# Patient Record
Sex: Female | Born: 1990 | Race: White | Hispanic: Yes | Marital: Single | Smoking: Former smoker
Health system: Southern US, Community
[De-identification: ages and names within clinical notes are randomized; demographics above are authoritative.]

## PROBLEM LIST (undated history)

## (undated) DIAGNOSIS — K219 Gastro-esophageal reflux disease without esophagitis: Secondary | ICD-10-CM

## (undated) HISTORY — PX: NO PAST SURGERIES: SHX2092

---
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
## 2010-05-13 LAB — POCT URINALYSIS DIP (DEVICE)
Glucose, UA: NEGATIVE mg/dL
Specific Gravity, Urine: 1.025 (ref 1.005–1.030)
Urobilinogen, UA: 0.2 mg/dL (ref 0.0–1.0)

## 2010-10-27 LAB — BASIC METABOLIC PANEL
CO2: 21
Calcium: 10.6 — ABNORMAL HIGH
Creatinine, Ser: 0.93
Glucose, Bld: 95

## 2010-10-27 LAB — URINE MICROSCOPIC-ADD ON

## 2010-10-27 LAB — CBC
MCHC: 34.2
RDW: 12.5

## 2010-10-27 LAB — DIFFERENTIAL
Basophils Absolute: 0
Basophils Relative: 0
Eosinophils Absolute: 0
Monocytes Absolute: 0.5
Neutro Abs: 5.6
Neutrophils Relative %: 73 — ABNORMAL HIGH

## 2010-10-27 LAB — URINALYSIS, ROUTINE W REFLEX MICROSCOPIC
Glucose, UA: NEGATIVE
Ketones, ur: >80 — AB
Specific Gravity, Urine: 1.021
pH: 6.5

## 2010-10-27 LAB — URINE CULTURE: Colony Count: 15000

## 2011-01-23 ENCOUNTER — Encounter: Payer: Self-pay | Admitting: *Deleted

## 2011-01-23 ENCOUNTER — Inpatient Hospital Stay (HOSPITAL_COMMUNITY)
Admission: AD | Admit: 2011-01-23 | Discharge: 2011-01-23 | Disposition: A | Discharge status: Home / Self Care | Payer: Federal, State, Local not specified - PPO | Source: Ambulatory Visit | Attending: Obstetrics and Gynecology | Admitting: Obstetrics and Gynecology

## 2011-01-23 ENCOUNTER — Encounter (HOSPITAL_COMMUNITY): Payer: Self-pay | Admitting: *Deleted

## 2011-01-23 ENCOUNTER — Inpatient Hospital Stay (HOSPITAL_COMMUNITY): Payer: Federal, State, Local not specified - PPO

## 2011-01-23 ENCOUNTER — Emergency Department (INDEPENDENT_AMBULATORY_CARE_PROVIDER_SITE_OTHER)
Admission: EM | Admit: 2011-01-23 | Discharge: 2011-01-23 | Disposition: A | Discharge status: Home / Self Care | Payer: Federal, State, Local not specified - PPO | Source: Home / Self Care | Attending: Emergency Medicine | Admitting: Emergency Medicine

## 2011-01-23 DIAGNOSIS — O4690 Antepartum hemorrhage, unspecified, unspecified trimester: Secondary | ICD-10-CM

## 2011-01-23 DIAGNOSIS — O209 Hemorrhage in early pregnancy, unspecified: Secondary | ICD-10-CM | POA: Insufficient documentation

## 2011-01-23 DIAGNOSIS — R109 Unspecified abdominal pain: Secondary | ICD-10-CM | POA: Insufficient documentation

## 2011-01-23 LAB — WET PREP, GENITAL: Yeast Wet Prep HPF POC: NONE SEEN

## 2011-01-23 LAB — ABO/RH: ABO/RH(D): A NEG

## 2011-01-23 MED ORDER — PROMETHAZINE HCL 25 MG PO TABS: 25.0000 mg | ORAL_TABLET | Freq: Four times a day (QID) | ORAL | Status: DC | PRN

## 2011-01-23 MED ORDER — PRENATAL RX 60-1 MG PO TABS: 1.0000 | ORAL_TABLET | Freq: Every day | ORAL | Status: DC

## 2011-01-23 MED ORDER — RHO D IMMUNE GLOBULIN 1500 UNIT/2ML IJ SOLN
300.0000 ug | Freq: Once | INTRAMUSCULAR | Status: AC
  Administered 2011-01-23: 300 ug via INTRAMUSCULAR

## 2011-01-23 NOTE — ED Notes (Signed)
Pt with vaginal spotting and abd cramping x one week c/o nausea and headaches

## 2011-01-23 NOTE — Progress Notes (Signed)
Wynelle Bourgeois CNM in to see pt. Spec exam done and wet prep and GC/Chlam obtained. PT tol well. Discussed u/s results and need for Rhophylac. Pt agrees with plan of care.

## 2011-01-23 NOTE — ED Notes (Signed)
Pt unable to recall last menstrual cycle probable in October

## 2011-01-23 NOTE — ED Provider Notes (Signed)
History     CSN: [id number]  Arrival date & time 01/23/11  1652   First MD Initiated Contact with Patient 01/23/11 1708      Chief Complaint  Patient presents with  . Vaginal Bleeding  . Abdominal Pain    (Consider location/radiation/quality/duration/timing/severity/associated sxs/prior treatment) HPI Comments: Spotting and having cramping pains for 1 week, feeling nauseous and headaches, not sure when was my LMP" " Maybe 2 MONTHS AGO" " No i have not had a urine test at home, wasn't sure" cramping pains come and go"  Patient is a 20 y.o. female presenting with vaginal bleeding and abdominal pain. The history is provided by the patient.  Vaginal Bleeding This is a recurrent problem. The current episode started more than 1 week ago. The problem occurs every several days. Associated symptoms include abdominal pain. Pertinent negatives include no headaches. The symptoms are relieved by nothing.  Abdominal Pain The primary symptoms of the illness include abdominal pain, nausea and vaginal bleeding. The primary symptoms of the illness do not include vomiting or vaginal discharge.  Symptoms associated with the illness do not include frequency.    History reviewed. No pertinent past medical history.  History reviewed. No pertinent past surgical history.  History reviewed. No pertinent family history.  History  Substance Use Topics  . Smoking status: Current Everyday Smoker  . Smokeless tobacco: Not on file  . Alcohol Use: Yes    OB History    Grav Para Term Preterm Abortions TAB SAB Ect Mult Living                  Review of Systems  Gastrointestinal: Positive for nausea and abdominal pain. Negative for vomiting and rectal pain.  Genitourinary: Positive for vaginal bleeding and pelvic pain. Negative for frequency, vaginal discharge, genital sores and vaginal pain.  Neurological: Negative for headaches.    Allergies  Review of patient's allergies indicates no known  allergies.  Home Medications  No current outpatient prescriptions on file.  BP 126/59  Pulse 71  Temp(Src) 98.7 F (37.1 C) (Oral)  Resp 16  SpO2 100%  Physical Exam  Nursing note and vitals reviewed. Constitutional: Vital signs are normal. She appears well-developed. She is active.  Non-toxic appearance. She does not have a sickly appearance. She does not appear ill. No distress.       Deferred exam- will be transferred to [facility] for comprehensive exam-  Neurological: She is alert.    ED Course  Procedures (including critical care time)   Labs Reviewed  POCT PREGNANCY, URINE  POCT PREGNANCY, URINE   No results found.   1. Vaginal bleeding in pregnancy       MDM  Intermittent VB + pregnancy test and unknown LMP with pelvic cramps and pains- Stable comfortable- Discussed transferred with MAU (H.N) for further evaluation        Jimmie Molly, MD 01/23/11 1936

## 2011-01-23 NOTE — ED Provider Notes (Signed)
History     Chief Complaint  Patient presents with  . Vaginal Bleeding  . Abdominal Pain   HPI This is a 20 y.o. female at [redacted]w[redacted]d who presents with C/O cramping and bleeding for several days. Went to Urgent Care and was diagnosed with pregnancy and sent here. No other symptoms except some nausea   OB History    Grav Para Term Preterm Abortions TAB SAB Ect Mult Living   2               Past Medical History  Diagnosis Date  . No pertinent past medical history     Past Surgical History  Procedure Date  . No past surgeries     Family History  Problem Relation Age of Onset  . Anesthesia problems Neg Hx   . Hypotension Neg Hx   . Pseudochol deficiency Neg Hx   . Malignant hyperthermia Neg Hx     History  Substance Use Topics  . Smoking status: Current Everyday Smoker  . Smokeless tobacco: Not on file  . Alcohol Use: Yes    Allergies: No Known Allergies  No prescriptions prior to admission    ROS See above  Physical Exam   Blood pressure 109/57, pulse 72, temperature 98.7 F (37.1 C), temperature source Oral, resp. rate 16, height 5' 2.25" (1.581 m), weight 101 lb 4 oz (45.927 kg), last menstrual period 11/23/2010.  Physical Exam  Constitutional: She is oriented to person, place, and time. She appears well-developed and well-nourished. No distress.  HENT:  Head: Normocephalic.  Cardiovascular: Normal rate.   Respiratory: Effort normal.  GI: Soft. She exhibits no distension and no mass. There is no tenderness. There is no rebound and no guarding.  Genitourinary: Uterus normal. Vaginal discharge (blood tinged mucous) found.       Uterus small, nontender, adnexae nontender without mass  Musculoskeletal: Normal range of motion.  Neurological: She is alert and oriented to person, place, and time.  Skin: Skin is warm and dry.  Psychiatric: She has a normal mood and affect.   Results for orders placed during the hospital encounter of 01/23/11 (from the past 24  hour(s))  ABO/RH     Status: Normal   Collection Time   01/23/11  8:40 PM      Component Value Range   ABO/RH(D) A NEG    HCG, QUANTITATIVE, PREGNANCY     Status: Abnormal   Collection Time   01/23/11  8:40 PM      Component Value Range   hCG, Beta Chain, Quant, [place] [address] (*) <5 (mIU/mL)  WET PREP, GENITAL     Status: Abnormal   Collection Time   01/23/11  9:45 PM      Component Value Range   Yeast, Wet Prep NONE SEEN  NONE SEEN    Trich, Wet Prep NONE SEEN  NONE SEEN    Clue Cells, Wet Prep MANY (*) NONE SEEN    WBC, Wet Prep HPF POC FEW (*) NONE SEEN     US Ob Comp Less 14 Wks  01/23/2011  The *RADIOLOGY REPORT*  Clinical Data: Positive pregnancy test.  Vaginal bleeding.  OBSTETRIC <14 WK [place] AND TRANSVAGINAL OB US  Technique:  Both transabdominal and transvaginal ultrasound examinations were performed for complete evaluation of the gestation as well as the maternal uterus, adnexal regions, and pelvic cul-de-sac.  Transvaginal technique was performed to assess early pregnancy.  Comparison:  None.  Intrauterine gestational sac:  Single. Yolk sac: Visualized  Embryo: Visualized Cardiac Activity: Visualized Heart Rate: 127 bpm  CRL: 6.9    mm  6   w  4   d        [place] EDC: 09/14/2011  Maternal uterus/adnexae:  Small to moderate subchorionic hemorrhage is noted.  The maternal ovaries are normal bilaterally.  No evidence for adnexal mass.  No free fluid is noted in the cul-de-sac.  IMPRESSION: Single living intrauterine gestation at estimated 6-week-4-day gestational age.  There is small to moderate subchorionic hemorrhage associated.  Original Report Authenticated By: ERIC A. MANSELL, M.D.    MAU Course  Procedures  MDM Rhophylac given GC/Chlam/Wet prep done.  Assessment and Plan  A:  IUP at 6.4 weeks (per [place])      First Trimester bleeding, [facility] P:  D/C home after Rhophylac      Encouraged to seen Prenatal care     Rx Phenergan and PNV    [facility] 01/23/2011, 9:57 PM

## 2011-01-23 NOTE — Progress Notes (Signed)
Written and verbal d/c instructions given and understanding voiced. To begin prenatal care with provider of choice.

## 2011-01-23 NOTE — Progress Notes (Signed)
Pt states, " I started bleeding last Saturday and I thought I  Was starting my period, but then I had spotting off and on all week. I woke up at 2:45 am this morning having chills and sweats and was having cramping, shooting pain in my whole abdomen. I've been nauseated all day with mild cramping all day. I went to [facility] Urgent care and they told me I was pregnant and sent me here."

## 2011-01-24 ENCOUNTER — Other Ambulatory Visit: Payer: Self-pay | Admitting: Advanced Practice Midwife

## 2011-01-24 LAB — RH IG WORKUP (INCLUDES ABO/RH): Unit division: 0

## 2011-01-24 MED ORDER — PROMETHAZINE HCL 25 MG PO TABS: 25.0000 mg | ORAL_TABLET | Freq: Four times a day (QID) | ORAL | Status: AC | PRN

## 2011-01-24 MED ORDER — PRENATAL RX 60-1 MG PO TABS: 1.0000 | ORAL_TABLET | Freq: Every day | ORAL | Status: AC

## 2011-01-24 NOTE — ED Provider Notes (Signed)
Agree with above note.  Lori Glass 01/24/2011 7:45 AM

## 2011-01-25 LAB — GC/CHLAMYDIA PROBE AMP, GENITAL
Chlamydia, DNA Probe: NEGATIVE
GC Probe Amp, Genital: NEGATIVE

## 2011-02-18 LAB — OB RESULTS CONSOLE RUBELLA ANTIBODY, IGM: Rubella: IMMUNE

## 2011-02-18 LAB — OB RESULTS CONSOLE ABO/RH: RH Type: NEGATIVE

## 2011-02-18 LAB — OB RESULTS CONSOLE RPR: RPR: NONREACTIVE

## 2011-02-18 LAB — OB RESULTS CONSOLE HIV ANTIBODY (ROUTINE TESTING): HIV: NONREACTIVE

## 2011-02-18 LAB — OB RESULTS CONSOLE ANTIBODY SCREEN: Antibody Screen: POSITIVE

## 2011-08-30 NOTE — H&P (Signed)
Lori Glass  DICTATION # [id number] CSN# [id number]   Meriel Pica, MD 08/30/2011 2:05 PM

## 2011-08-31 ENCOUNTER — Encounter (HOSPITAL_COMMUNITY): Payer: Self-pay

## 2011-08-31 NOTE — H&P (Signed)
Lori Glass, [name]                 ACCOUNT NO.:  [id number]  MEDICAL RECORD NO.:  [id number]  LOCATION:                                 FACILITY:  PHYSICIAN:  Lori Glass, M.D.DATE OF BIRTH:  1990-11-27  DATE OF ADMISSION:  09/09/2011 DATE OF DISCHARGE:                             HISTORY & PHYSICAL   CHIEF COMPLAINT:  Breech presentation at term.  HISTORY OF PRESENT ILLNESS:  A 21 year old, G1, P0, EDD September 15, 2011, presents for primary cesarean section for persistent breech presentation at term.  GBS culture was negative.  Her prenatal course has otherwise been uneventful.  The procedure including risks related to bleeding, infection, transfusion adjacent to organ injury, wound infection, phlebitis all reviewed with her, which she understands and accepts.  She is Rh negative.  PAST MEDICAL HISTORY:  Please see the [facility] form for details.  PHYSICAL EXAMINATION:  VITAL SIGNS:  Temp 98.2, blood pressure 120/72. HEENT:  Unremarkable. NECK:  Supple without masses. LUNGS:  Clear. CARDIOVASCULAR:  Regular rate and rhythm without murmurs, rubs, or gallops. BREASTS:  Not examined. PELVIC:  Term fundal height.  Breech presentation.  Fetal heart rate 148.  Cervix was closed. EXTREMITIES:  Unremarkable. NEUROLOGIC:  Unremarkable.  IMPRESSION:  Persistent breech presentation at term.  PLAN:  Primary cesarean section.  Procedure and risks discussed as above.     Lori Glass M. Marcelle Glass, M.D.     RMH/MEDQ  D:  08/30/2011  T:  08/31/2011  Job:  [id number]

## 2011-09-01 ENCOUNTER — Encounter (HOSPITAL_COMMUNITY): Payer: Self-pay | Admitting: Pharmacist

## 2011-09-02 ENCOUNTER — Encounter (HOSPITAL_COMMUNITY)
Admission: RE | Admit: 2011-09-02 | Discharge: 2011-09-02 | Disposition: A | Discharge status: Home / Self Care | Payer: Federal, State, Local not specified - PPO | Source: Ambulatory Visit | Attending: Obstetrics and Gynecology | Admitting: Obstetrics and Gynecology

## 2011-09-02 ENCOUNTER — Encounter (HOSPITAL_COMMUNITY): Payer: Self-pay

## 2011-09-02 HISTORY — DX: Gastro-esophageal reflux disease without esophagitis: K21.9

## 2011-09-02 LAB — CBC
HCT: 37.6 % (ref 36.0–46.0)
Hemoglobin: 12 g/dL (ref 12.0–15.0)
MCH: 27.1 pg (ref 26.0–34.0)
MCV: 85.1 fL (ref 78.0–100.0)
RBC: 4.42 MIL/uL (ref 3.87–5.11)

## 2011-09-02 LAB — TYPE AND SCREEN
Antibody Screen: POSITIVE
DAT, IgG: NEGATIVE

## 2011-09-02 NOTE — Patient Instructions (Addendum)
   Your procedure is scheduled on: Thursday August 8th  Enter through the Main Entrance of [facility] at: 11:30am Pick up the phone at the desk and dial [phone] and inform us of your arrival.  Please call this number if you have any problems the morning of surgery: [phone]  Remember: Do not eat food after midnight: Wednesday Do not drink clear liquids after: Thursday at 9am Take these medicines the morning of surgery : Prilosec Do not wear jewelry, make-up, or FINGER nail polish No metal in your hair or on your body. Do not wear lotions, powders, perfumes or deodorant. Do not shave 48 hours prior to surgery. Do not bring valuables to the hospital. Contacts, dentures or bridgework may not be worn into surgery.  Leave suitcase in the car. After Surgery it may be brought to your room. For patients being admitted to the hospital, checkout time is 11:00am the day of discharge.     Remember to use your hibiclens as instructed.Please shower with 1/2 bottle the evening before your surgery and the other 1/2 bottle the morning of surgery. Neck down avoiding private area.

## 2011-09-06 ENCOUNTER — Inpatient Hospital Stay (HOSPITAL_COMMUNITY)
Admission: AD | Admit: 2011-09-06 | Discharge: 2011-09-09 | DRG: 371 | Disposition: A | Discharge status: Home / Self Care | Payer: Federal, State, Local not specified - PPO | Source: Ambulatory Visit | Attending: Obstetrics and Gynecology | Admitting: Obstetrics and Gynecology

## 2011-09-06 DIAGNOSIS — O321XX0 Maternal care for breech presentation, not applicable or unspecified: Principal | ICD-10-CM | POA: Diagnosis present

## 2011-09-06 NOTE — MAU Note (Signed)
Pt reports ROM at 2300, clear fluid. No contractions

## 2011-09-07 ENCOUNTER — Encounter (HOSPITAL_COMMUNITY): Payer: Self-pay | Admitting: *Deleted

## 2011-09-07 ENCOUNTER — Encounter (HOSPITAL_COMMUNITY): Payer: Self-pay

## 2011-09-07 ENCOUNTER — Inpatient Hospital Stay (HOSPITAL_COMMUNITY): Payer: Federal, State, Local not specified - PPO

## 2011-09-07 ENCOUNTER — Encounter (HOSPITAL_COMMUNITY)
Admission: AD | Disposition: A | Discharge status: Home / Self Care | Payer: Self-pay | Source: Ambulatory Visit | Attending: Obstetrics and Gynecology

## 2011-09-07 LAB — CBC
Hemoglobin: 12 g/dL (ref 12.0–15.0)
MCH: 27.1 pg (ref 26.0–34.0)
MCHC: 32.2 g/dL (ref 30.0–36.0)
RDW: 14 % (ref 11.5–15.5)

## 2011-09-07 SURGERY — Surgical Case
Anesthesia: Spinal | Site: Abdomen | Wound class: Clean Contaminated

## 2011-09-07 MED ORDER — MORPHINE SULFATE (PF) 0.5 MG/ML IJ SOLN
INTRAMUSCULAR | Status: DC | PRN
  Administered 2011-09-07: .1 mg via INTRATHECAL

## 2011-09-07 MED ORDER — DIBUCAINE 1 % RE OINT: 1.0000 "application " | TOPICAL_OINTMENT | RECTAL | Status: DC | PRN

## 2011-09-07 MED ORDER — LACTATED RINGERS IV SOLN
INTRAVENOUS | Status: DC | PRN
  Administered 2011-09-07: 01:00:00 via INTRAVENOUS

## 2011-09-07 MED ORDER — KETOROLAC TROMETHAMINE 60 MG/2ML IM SOLN
60.0000 mg | Freq: Once | INTRAMUSCULAR | Status: AC | PRN
  Administered 2011-09-07: 60 mg via INTRAMUSCULAR

## 2011-09-07 MED ORDER — SCOPOLAMINE 1 MG/3DAYS TD PT72
1.0000 | MEDICATED_PATCH | Freq: Once | TRANSDERMAL | Status: DC
  Administered 2011-09-07: 1.5 mg via TRANSDERMAL

## 2011-09-07 MED ORDER — SODIUM CHLORIDE 0.9 % IV SOLN
1.0000 ug/kg/h | INTRAVENOUS | Status: DC | PRN
  Filled 2011-09-07: qty 2.5

## 2011-09-07 MED ORDER — KETOROLAC TROMETHAMINE 60 MG/2ML IM SOLN
INTRAMUSCULAR | Status: AC
  Administered 2011-09-07: 60 mg via INTRAMUSCULAR
  Filled 2011-09-07: qty 2

## 2011-09-07 MED ORDER — IBUPROFEN 100 MG/5ML PO SUSP
600.0000 mg | Freq: Four times a day (QID) | ORAL | Status: DC
  Administered 2011-09-07 – 2011-09-09 (×9): 600 mg via ORAL
  Filled 2011-09-07 (×9): qty 30

## 2011-09-07 MED ORDER — CITRIC ACID-SODIUM CITRATE 334-500 MG/5ML PO SOLN
ORAL | Status: AC
  Administered 2011-09-07: 30 mL via ORAL
  Filled 2011-09-07: qty 15

## 2011-09-07 MED ORDER — OXYTOCIN 10 UNIT/ML IJ SOLN
40.0000 [IU] | INTRAVENOUS | Status: DC | PRN
  Administered 2011-09-07: 40 [IU] via INTRAVENOUS

## 2011-09-07 MED ORDER — METOCLOPRAMIDE HCL 5 MG/ML IJ SOLN: 10.0000 mg | Freq: Three times a day (TID) | INTRAMUSCULAR | Status: DC | PRN

## 2011-09-07 MED ORDER — FAMOTIDINE IN NACL 20-0.9 MG/50ML-% IV SOLN
INTRAVENOUS | Status: AC
  Administered 2011-09-07: 20 mg via INTRAVENOUS
  Filled 2011-09-07: qty 50

## 2011-09-07 MED ORDER — LANOLIN HYDROUS EX OINT: 1.0000 "application " | TOPICAL_OINTMENT | CUTANEOUS | Status: DC | PRN

## 2011-09-07 MED ORDER — SODIUM CHLORIDE 0.9 % IJ SOLN: 3.0000 mL | INTRAMUSCULAR | Status: DC | PRN

## 2011-09-07 MED ORDER — OXYCODONE-ACETAMINOPHEN 5-325 MG PO TABS: 1.0000 | ORAL_TABLET | ORAL | Status: DC | PRN

## 2011-09-07 MED ORDER — ONDANSETRON HCL 4 MG/2ML IJ SOLN: 4.0000 mg | Freq: Three times a day (TID) | INTRAMUSCULAR | Status: DC | PRN

## 2011-09-07 MED ORDER — OXYCODONE-ACETAMINOPHEN 5-325 MG/5ML PO SOLN
5.0000 mL | ORAL | Status: DC | PRN
  Administered 2011-09-07 – 2011-09-08 (×2): 5 mL via ORAL
  Administered 2011-09-08: 10 mL via ORAL
  Administered 2011-09-08: 5 mL via ORAL
  Administered 2011-09-08: 10 mL via ORAL
  Administered 2011-09-08: 5 mL via ORAL
  Administered 2011-09-09 (×2): 10 mL via ORAL
  Filled 2011-09-07 (×2): qty 10
  Filled 2011-09-07 (×2): qty 5
  Filled 2011-09-07: qty 10
  Filled 2011-09-07 (×5): qty 5

## 2011-09-07 MED ORDER — LACTATED RINGERS IV SOLN
INTRAVENOUS | Status: DC | PRN
  Administered 2011-09-07: 02:00:00 via INTRAVENOUS

## 2011-09-07 MED ORDER — KETOROLAC TROMETHAMINE 30 MG/ML IJ SOLN: 30.0000 mg | Freq: Four times a day (QID) | INTRAMUSCULAR | Status: AC | PRN

## 2011-09-07 MED ORDER — SODIUM CHLORIDE 0.9 % IV SOLN: 250.0000 mL | INTRAVENOUS | Status: DC

## 2011-09-07 MED ORDER — FLEET ENEMA 7-19 GM/118ML RE ENEM: 1.0000 | ENEMA | Freq: Every day | RECTAL | Status: DC | PRN

## 2011-09-07 MED ORDER — DIPHENHYDRAMINE HCL 25 MG PO CAPS: 25.0000 mg | ORAL_CAPSULE | Freq: Four times a day (QID) | ORAL | Status: DC | PRN

## 2011-09-07 MED ORDER — DIPHENHYDRAMINE HCL 50 MG/ML IJ SOLN: 25.0000 mg | INTRAMUSCULAR | Status: DC | PRN

## 2011-09-07 MED ORDER — PRENATAL MULTIVITAMIN CH
1.0000 | ORAL_TABLET | Freq: Every day | ORAL | Status: DC
  Filled 2011-09-07: qty 1

## 2011-09-07 MED ORDER — FENTANYL CITRATE 0.05 MG/ML IJ SOLN
INTRAMUSCULAR | Status: DC | PRN
  Administered 2011-09-07: 15 ug via INTRATHECAL

## 2011-09-07 MED ORDER — IBUPROFEN 600 MG PO TABS: 600.0000 mg | ORAL_TABLET | Freq: Four times a day (QID) | ORAL | Status: DC | PRN

## 2011-09-07 MED ORDER — MEPERIDINE HCL 25 MG/ML IJ SOLN: 6.2500 mg | INTRAMUSCULAR | Status: DC | PRN

## 2011-09-07 MED ORDER — TETANUS-DIPHTH-ACELL PERTUSSIS 5-2.5-18.5 LF-MCG/0.5 IM SUSP: 0.5000 mL | Freq: Once | INTRAMUSCULAR | Status: DC

## 2011-09-07 MED ORDER — OXYTOCIN 40 UNITS IN LACTATED RINGERS INFUSION - SIMPLE MED: 62.5000 mL/h | INTRAVENOUS | Status: AC

## 2011-09-07 MED ORDER — SIMETHICONE 80 MG PO CHEW: 80.0000 mg | CHEWABLE_TABLET | ORAL | Status: DC | PRN

## 2011-09-07 MED ORDER — WITCH HAZEL-GLYCERIN EX PADS: 1.0000 "application " | MEDICATED_PAD | CUTANEOUS | Status: DC | PRN

## 2011-09-07 MED ORDER — SENNOSIDES-DOCUSATE SODIUM 8.6-50 MG PO TABS
2.0000 | ORAL_TABLET | Freq: Every day | ORAL | Status: DC
  Administered 2011-09-07 – 2011-09-08 (×2): 2 via ORAL

## 2011-09-07 MED ORDER — FENTANYL CITRATE 0.05 MG/ML IJ SOLN
INTRAMUSCULAR | Status: DC | PRN
  Administered 2011-09-07: 50 ug via INTRAVENOUS
  Administered 2011-09-07: 25 ug via INTRAVENOUS

## 2011-09-07 MED ORDER — MENTHOL 3 MG MT LOZG: 1.0000 | LOZENGE | OROMUCOSAL | Status: DC | PRN

## 2011-09-07 MED ORDER — ZOLPIDEM TARTRATE 5 MG PO TABS: 5.0000 mg | ORAL_TABLET | Freq: Every evening | ORAL | Status: DC | PRN

## 2011-09-07 MED ORDER — ONDANSETRON HCL 4 MG/2ML IJ SOLN: 4.0000 mg | INTRAMUSCULAR | Status: DC | PRN

## 2011-09-07 MED ORDER — COMPLETENATE 29-1 MG PO CHEW
1.0000 | CHEWABLE_TABLET | Freq: Every day | ORAL | Status: DC
  Administered 2011-09-07 – 2011-09-09 (×3): 1 via ORAL
  Filled 2011-09-07 (×3): qty 1

## 2011-09-07 MED ORDER — DIPHENHYDRAMINE HCL 25 MG PO CAPS: 25.0000 mg | ORAL_CAPSULE | ORAL | Status: DC | PRN

## 2011-09-07 MED ORDER — BUPIVACAINE IN DEXTROSE 0.75-8.25 % IT SOLN
INTRATHECAL | Status: DC | PRN
  Administered 2011-09-07: 1.5 mL via INTRATHECAL

## 2011-09-07 MED ORDER — NALOXONE HCL 0.4 MG/ML IJ SOLN: 0.4000 mg | INTRAMUSCULAR | Status: DC | PRN

## 2011-09-07 MED ORDER — ONDANSETRON HCL 4 MG PO TABS: 4.0000 mg | ORAL_TABLET | ORAL | Status: DC | PRN

## 2011-09-07 MED ORDER — SCOPOLAMINE 1 MG/3DAYS TD PT72
MEDICATED_PATCH | TRANSDERMAL | Status: AC
  Administered 2011-09-07: 1.5 mg via TRANSDERMAL
  Filled 2011-09-07: qty 1

## 2011-09-07 MED ORDER — ONDANSETRON HCL 4 MG/2ML IJ SOLN
INTRAMUSCULAR | Status: DC | PRN
  Administered 2011-09-07: 4 mg via INTRAVENOUS

## 2011-09-07 MED ORDER — 0.9 % SODIUM CHLORIDE (POUR BTL) OPTIME
TOPICAL | Status: DC | PRN
  Administered 2011-09-07: 500 mL

## 2011-09-07 MED ORDER — SIMETHICONE 80 MG PO CHEW
80.0000 mg | CHEWABLE_TABLET | Freq: Three times a day (TID) | ORAL | Status: DC
  Administered 2011-09-07 – 2011-09-09 (×8): 80 mg via ORAL

## 2011-09-07 MED ORDER — BISACODYL 10 MG RE SUPP: 10.0000 mg | Freq: Every day | RECTAL | Status: DC | PRN

## 2011-09-07 MED ORDER — LACTATED RINGERS IV SOLN
INTRAVENOUS | Status: DC
  Administered 2011-09-07: 125 mL/h via INTRAVENOUS

## 2011-09-07 MED ORDER — DIPHENHYDRAMINE HCL 50 MG/ML IJ SOLN: 12.5000 mg | INTRAMUSCULAR | Status: DC | PRN

## 2011-09-07 MED ORDER — FENTANYL CITRATE 0.05 MG/ML IJ SOLN: 25.0000 ug | INTRAMUSCULAR | Status: DC | PRN

## 2011-09-07 MED ORDER — EPHEDRINE SULFATE 50 MG/ML IJ SOLN
INTRAMUSCULAR | Status: DC | PRN
  Administered 2011-09-07: 10 mg via INTRAVENOUS

## 2011-09-07 MED ORDER — NALBUPHINE SYRINGE 5 MG/0.5 ML
5.0000 mg | INJECTION | INTRAMUSCULAR | Status: DC | PRN
  Filled 2011-09-07: qty 1

## 2011-09-07 MED ORDER — IBUPROFEN 600 MG PO TABS: 600.0000 mg | ORAL_TABLET | Freq: Four times a day (QID) | ORAL | Status: DC

## 2011-09-07 MED ORDER — CEFAZOLIN SODIUM-DEXTROSE 2-3 GM-% IV SOLR
INTRAVENOUS | Status: AC
  Filled 2011-09-07: qty 50

## 2011-09-07 MED ORDER — FAMOTIDINE IN NACL 20-0.9 MG/50ML-% IV SOLN
20.0000 mg | Freq: Once | INTRAVENOUS | Status: AC
  Administered 2011-09-07: 20 mg via INTRAVENOUS

## 2011-09-07 MED ORDER — CITRIC ACID-SODIUM CITRATE 334-500 MG/5ML PO SOLN
30.0000 mL | Freq: Once | ORAL | Status: AC
  Administered 2011-09-07: 30 mL via ORAL

## 2011-09-07 MED ORDER — CEFAZOLIN SODIUM-DEXTROSE 2-3 GM-% IV SOLR
2.0000 g | INTRAVENOUS | Status: AC
  Administered 2011-09-07: 2 g via INTRAVENOUS
  Filled 2011-09-07: qty 50

## 2011-09-07 MED ORDER — SODIUM CHLORIDE 0.9 % IJ SOLN: 3.0000 mL | Freq: Two times a day (BID) | INTRAMUSCULAR | Status: DC

## 2011-09-07 SURGICAL SUPPLY — 27 items
CLOTH BEACON ORANGE TIMEOUT ST (SAFETY) ×2 IMPLANT
DRESSING TELFA 8X3 (GAUZE/BANDAGES/DRESSINGS) IMPLANT
ELECT REM PT RETURN 9FT ADLT (ELECTROSURGICAL) ×2
ELECTRODE REM PT RTRN 9FT ADLT (ELECTROSURGICAL) ×1 IMPLANT
EXTRACTOR VACUUM M CUP 4 TUBE (SUCTIONS) IMPLANT
GAUZE SPONGE 4X4 12PLY STRL LF (GAUZE/BANDAGES/DRESSINGS) ×2 IMPLANT
GLOVE BIO SURGEON STRL SZ7 (GLOVE) ×4 IMPLANT
GOWN PREVENTION PLUS LG XLONG (DISPOSABLE) ×4 IMPLANT
KIT ABG SYR 3ML LUER SLIP (SYRINGE) ×2 IMPLANT
NEEDLE HYPO 25X5/8 SAFETYGLIDE (NEEDLE) ×2 IMPLANT
NS IRRIG 1000ML POUR BTL (IV SOLUTION) ×2 IMPLANT
PACK C SECTION WH (CUSTOM PROCEDURE TRAY) ×2 IMPLANT
PAD ABD 7.5X8 STRL (GAUZE/BANDAGES/DRESSINGS) IMPLANT
SLEEVE SCD COMPRESS KNEE MED (MISCELLANEOUS) ×2 IMPLANT
STAPLER VISISTAT 35W (STAPLE) ×2 IMPLANT
STRIP CLOSURE SKIN 1/4X4 (GAUZE/BANDAGES/DRESSINGS) IMPLANT
SUT CHROMIC 0 CT 1 (SUTURE) IMPLANT
SUT CHROMIC 0 CTX 36 (SUTURE) ×4 IMPLANT
SUT CHROMIC 2 0 SH (SUTURE) IMPLANT
SUT PDS AB 0 CT 36 (SUTURE) ×2 IMPLANT
SUT PLAIN 0 NONE (SUTURE) IMPLANT
SUT PLAIN 2 0 XLH (SUTURE) IMPLANT
SUT VIC AB 3-0 CT1 27 (SUTURE) ×1
SUT VIC AB 3-0 CT1 TAPERPNT 27 (SUTURE) ×1 IMPLANT
TOWEL OR 17X24 6PK STRL BLUE (TOWEL DISPOSABLE) ×4 IMPLANT
TRAY FOLEY CATH 14FR (SET/KITS/TRAYS/PACK) ×2 IMPLANT
WATER STERILE IRR 1000ML POUR (IV SOLUTION) ×2 IMPLANT

## 2011-09-07 NOTE — Brief Op Note (Signed)
09/06/2011 - 09/07/2011  1:57 AM  PATIENT:  Lori Glass  21 y.o. female  PRE-OPERATIVE DIAGNOSIS:  ruptured, breech  POST-OPERATIVE DIAGNOSIS:  ruptured, breech  PROCEDURE:  Procedure(s) (LRB): CESAREAN SECTION (N/A)  SURGEON:  Surgeon(s) and Role:    * Juluis Mire, MD - Primary  PHYSICIAN ASSISTANT:   ASSISTANTS: none   ANESTHESIA:   spinal  EBL:  Total I/O In: 1800 [I.V.:1800] Out: -   BLOOD ADMINISTERED:none  DRAINS: Urinary Catheter (Foley)   LOCAL MEDICATIONS USED:  NONE  SPECIMEN:  Source of Specimen:  placenta  DISPOSITION OF SPECIMEN:  PATHOLOGY  COUNTS:  YES  TOURNIQUET:  * No tourniquets in log *  DICTATION: .Other Dictation: Dictation Number [id number]  PLAN OF CARE: Admit to inpatient   PATIENT DISPOSITION:  PACU - hemodynamically stable.   Delay start of Pharmacological VTE agent (>24hrs) due to surgical blood loss or risk of bleeding: no

## 2011-09-07 NOTE — Transfer of Care (Signed)
Immediate Anesthesia Transfer of Care Note  Patient: Lori Glass  Procedure(s) Performed: Procedure(s) (LRB): CESAREAN SECTION (N/A)  Patient Location: PACU  Anesthesia Type: Spinal  Level of Consciousness: awake, alert  and oriented  Airway & Oxygen Therapy: Patient Spontanous Breathing  Post-op Assessment: Report given to PACU RN and Post -op Vital signs reviewed and stable  Post vital signs: Reviewed and stable  Complications: No apparent anesthesia complications

## 2011-09-07 NOTE — H&P (Signed)
Lori Glass is a 21 y.o. female since to MAU with spontaneous rupture membranes. Estimated gestational age is 38+ weeks. She has a persistent breech presentation. She was scheduled for cesarean section later this week. Ultrasound in MAU confirms breech presentation. Exam revealed gross rupture of membranes. Patient proceed with primary cesarean section. Maternal Medical History:  Reason for admission: Reason for admission: rupture of membranes.  Contractions: Onset was 1-2 hours ago.   Frequency: rare.   Perceived severity is mild.    Fetal activity: Perceived fetal activity is normal.    Prenatal Complications - Diabetes: none.    OB History    Grav Para Term Preterm Abortions TAB SAB Ect Mult Living   1              Past Medical History  Diagnosis Date  . GERD (gastroesophageal reflux disease)     prilosec   Past Surgical History  Procedure Date  . No past surgeries    Family History: family history includes Arthritis in her mother; Asthma in her mother; and Cancer in her maternal grandfather.  There is no history of Anesthesia problems, and Hypotension, and Pseudochol deficiency, and Malignant hyperthermia, . Social History:  reports that she quit smoking about 8 months ago. She does not have any smokeless tobacco history on file. She reports that she does not drink alcohol or use illicit drugs.   Prenatal Transfer Tool  Maternal Diabetes: No Genetic Screening: Declined Maternal Ultrasounds/Referrals: Normal Fetal Ultrasounds or other Referrals:  None Maternal Substance Abuse:  No Significant Maternal Medications:  None Significant Maternal Lab Results:  None Other Comments:  None  Review of Systems  All other systems reviewed and are negative.      Blood pressure 123/72, pulse 89, temperature 98.1 F (36.7 C), temperature source Oral, resp. rate 20, height 5\' 2"  (1.575 m), weight 70.761 kg (156 lb), last menstrual period 11/23/2010, SpO2 100.00%. Maternal  Exam:  Uterine Assessment: Contraction strength is mild.  Contraction frequency is irregular.   Abdomen: Patient reports no abdominal tenderness. Fundal height is c/w dates.   Estimated fetal weight is 7.   Fetal presentation: breech  Introitus: Ferning test: positive.  Nitrazine test: not done. Amniotic fluid character: clear.  Cervix: Cervix evaluated by digital exam.     Physical Exam  Constitutional: She appears well-developed and well-nourished.  HENT:  Head: Normocephalic and atraumatic.  Eyes: Conjunctivae and EOM are normal. Pupils are equal, round, and reactive to light.  Cardiovascular: Normal rate, regular rhythm and normal heart sounds.   Respiratory: Effort normal.  GI:       Gravid uterus  Genitourinary:       Gross rom  Clear  Cervix long and closed  breech  Neurological: She is alert. She has normal reflexes.    Prenatal labs: ABO, Rh: --/--/A NEG (08/01 1610) Antibody: POS (08/01 0953) Rubella: Immune (01/17 0000) RPR: NON REACTIVE (08/01 0953)  HBsAg: Negative (01/17 0000)  HIV: Non-reactive (01/17 0000)  GBS:     Assessment/Plan: Intrauterine pregnancy at 38+ weeks with spontaneous rupture membranes and breech presentation. Proceed with primary cesarean section.Risk of cesarean section discussed.  These include:  Risk of infection;  Risk of hemorrhage that could require transfusions with the associated risk of aids or hepatitis;  Excessive bleeding could require hysterectomy;  Risk of injury to adjacent organs including bladder, bowel or ureters;  Risk of DVT's and possible pulmonary embolus.  Patient expresses a understanding of indications and risks.;  Lori Glass S 09/07/2011, 12:39 AM

## 2011-09-07 NOTE — Anesthesia Postprocedure Evaluation (Signed)
Anesthesia Post Note  Patient: Lori Glass  Procedure(s) Performed: Procedure(s) (LRB): CESAREAN SECTION (N/A)  Anesthesia type: Spinal  Patient location: PACU  Post pain: Pain level controlled  Post assessment: Post-op Vital signs reviewed  Last Vitals:  Filed Vitals:   09/07/11 0315  BP: 116/74  Pulse: 72  Temp:   Resp: 13    Post vital signs: Reviewed  Level of consciousness: awake  Complications: No apparent anesthesia complications

## 2011-09-07 NOTE — MAU Note (Signed)
Pt broke her water @ 2300 tonight.  She states she is breech.

## 2011-09-07 NOTE — Progress Notes (Addendum)
Subjective: Postpartum Day 0: Cesarean Delivery Patient reports tolerating PO.    Objective: Vital signs in last 24 hours: Temp:  [97.7 F (36.5 C)-98.8 F (37.1 C)] 97.8 F (36.6 C) (08/06 0649) Pulse Rate:  [70-96] 82  (08/06 0649) Resp:  [12-22] 18  (08/06 0649) BP: (116-137)/(66-89) 121/66 mmHg (08/06 0649) SpO2:  [98 %-100 %] 99 % (08/06 0649) Weight:  [70.761 kg (156 lb)] 70.761 kg (156 lb) (08/05 2350)  Physical Exam:  General: alert and cooperative Lochia: appropriate Uterine Fundus: firm Incision: abd. Dressing CDI DVT Evaluation: No evidence of DVT seen on physical exam. Foley with adequate op  Basename 09/07/11 0045  HGB 12.0  HCT 37.3    Assessment/Plan: Status post Cesarean section. Doing well postoperatively.  Continue current care.  Latasha Puskas G 09/07/2011, 7:53 AM

## 2011-09-07 NOTE — Anesthesia Procedure Notes (Signed)
Spinal  Patient location during procedure: OR Start time: 09/07/2011 1:32 AM Staffing Performed by: anesthesiologist  Preanesthetic Checklist Completed: patient identified, site marked, surgical consent, pre-op evaluation, timeout performed, IV checked, risks and benefits discussed and monitors and equipment checked Spinal Block Patient position: sitting Prep: site prepped and draped and DuraPrep Patient monitoring: heart rate, cardiac monitor, continuous pulse ox and blood pressure Approach: midline Location: L3-4 Injection technique: single-shot Needle Needle type: Sprotte  Needle gauge: 24 G Needle length: 9 cm Assessment Sensory level: T4 Additional Notes Clear free flow CSF on first attempt.  No paresthesia.  Patient tolerated procedure well.  Jasmine December, MD

## 2011-09-07 NOTE — Op Note (Signed)
Lori Glass, Lori Glass NO.:  [id number]  MEDICAL RECORD NO.:  [id number]  LOCATION:  9101                          FACILITY:  [facility]  PHYSICIAN:  Juluis Mire, M.D.   DATE OF BIRTH:  October 05, 1990  DATE OF PROCEDURE:  09/07/2011 DATE OF DISCHARGE:                              OPERATIVE REPORT   PREOPERATIVE DIAGNOSES:  Intrauterine pregnancy at 38 weeks and 6 days. Spontaneous rupture of membranes.  Breech presentation.  POSTOPERATIVE DIAGNOSES:  Intrauterine pregnancy at 38 weeks and 6 days. Spontaneous rupture of membranes.  Breech presentation.  OPERATIVE PROCEDURE:  Low-transverse cesarean section.  SURGEON:  Juluis Mire, M.D.  ANESTHESIA:  Spinal.  ESTIMATED BLOOD LOSS:  500 to 600 mL.  PACKS:  None.  DRAINS:  Urethral Foley.  INTRAOPERATIVE BLOOD PLACED:  None.  COMPLICATIONS:  None.  INDICATION:  Dictated in the history and physical.  PROCEDURE:  As follows:  The patient was taken to the OR and placed in a supine position with left lateral tilt.  After a satisfactory level of spinal anesthesia was obtained, the abdomen was prepped out with Betadine and draped as a sterile field.  Low-transverse skin incision was made with a knife, carried through subcutaneous tissue.  Fascia was entered sharply, incision then fashioned laterally.  Fascia taken off the muscle superiorly and inferiorly.  Rectus muscles were separated in midline.  Perineum was entered sharply.  Incision of peritoneum extended both superiorly and inferiorly.  Low transverse bladder flap was developed.  A low transverse uterine incision was begun with a knife and extended laterally using manual traction.  Amniotic fluid was clear. The infant was in the frank breech presentation, was delivered in usual manner.  The infant was a viable female, Apgars were 9/9.  Umbilical artery pH was 7.31.  Weight is pending.  Placenta was delivered manually.  It looked like she had a velamentous  insertion.  The placenta was sent to Pathology.  At this time, the uterus exteriorized for closure.  Uterus was closed in a running locking suture of 0-chromic using a two-layer closure technique.  Areas of continued bleeding brought under control with figure-of-eights of 0-chromic.  Tubes and ovaries were unremarkable.  Uterus was returned to the abdominal cavity. We irrigated the pelvis, had good hemostasis, and clear urine output. Muscles and peritoneum closed with a running suture of 3-0 Vicryl.  Fascia closed with a running suture of 0- PDS.  Skin was closed with staples and Steri-Strips.  Sponge, instrument, and needle count was reported as correct by circulating nurse x2.  The patient tolerated the procedure well, was returned to the recovery room in good condition and urine output remained clear.     Juluis Mire, M.D.     JSM/MEDQ  D:  09/07/2011  T:  09/07/2011  Job:  [id number]

## 2011-09-07 NOTE — OR Nursing (Signed)
Fundal massage by DLWegner RN 100 cc blood loss

## 2011-09-07 NOTE — Op Note (Signed)
Patient name  Lori Glass, Lori Glass [id number] CSN# [id number]  [facility], MD 09/07/2011 1:57 AM

## 2011-09-07 NOTE — Anesthesia Preprocedure Evaluation (Addendum)
Anesthesia Evaluation  Patient identified by MRN, date of birth, ID band Patient awake    Reviewed: Allergy & Precautions, H&P , NPO status , Patient's Chart, lab work & pertinent test results, reviewed documented beta blocker date and time   History of Anesthesia Complications Negative for: history of anesthetic complications  Airway Mallampati: II TM Distance: >3 FB Neck ROM: full    Dental  (+) Teeth Intact   Pulmonary former smoker,  breath sounds clear to auscultation        Cardiovascular negative cardio ROS  Rhythm:regular Rate:Normal     Neuro/Psych negative neurological ROS  negative psych ROS   GI/Hepatic Neg liver ROS, GERD-  Medicated,  Endo/Other  negative endocrine ROS  Renal/GU negative Renal ROS  negative genitourinary   Musculoskeletal   Abdominal   Peds  Hematology negative hematology ROS (+)   Anesthesia Other Findings ate cereal at 8 pm  Reproductive/Obstetrics (+) Pregnancy (breech, ruptured membranes)                          Anesthesia Physical Anesthesia Plan  ASA: II and Emergent  Anesthesia Plan: Spinal   Post-op Pain Management:    Induction:   Airway Management Planned:   Additional Equipment:   Intra-op Plan:   Post-operative Plan:   Informed Consent: I have reviewed the patients History and Physical, chart, labs and discussed the procedure including the risks, benefits and alternatives for the proposed anesthesia with the patient or authorized representative who has indicated his/her understanding and acceptance.   Dental Advisory Given  Plan Discussed with: Surgeon and CRNA  Anesthesia Plan Comments:         Anesthesia Quick Evaluation

## 2011-09-08 LAB — CBC
MCH: 27.4 pg (ref 26.0–34.0)
MCHC: 32.5 g/dL (ref 30.0–36.0)
RDW: 14.2 % (ref 11.5–15.5)

## 2011-09-08 MED ORDER — RHO D IMMUNE GLOBULIN 1500 UNIT/2ML IJ SOLN
300.0000 ug | Freq: Once | INTRAMUSCULAR | Status: AC
  Administered 2011-09-08: 300 ug via INTRAMUSCULAR
  Filled 2011-09-08: qty 2

## 2011-09-08 NOTE — Progress Notes (Signed)
Subjective: Postpartum Day 1: Cesarean Delivery Patient reports tolerating PO and no problems voiding.    Objective: Vital signs in last 24 hours: Temp:  [98 F (36.7 C)-99 F (37.2 C)] 98 F (36.7 C) (08/07 0436) Pulse Rate:  [75-105] 81  (08/07 0436) Resp:  [18] 18  (08/07 0436) BP: (95-122)/(53-74) 99/61 mmHg (08/07 0436) SpO2:  [97 %-99 %] 97 % (08/07 0436)  Physical Exam:  General: alert and cooperative Lochia: appropriate Uterine Fundus: firm Incision: abd dressing CDI  DVT Evaluation: No evidence of DVT seen on physical exam.   Basename 09/08/11 0535 09/07/11 0045  HGB 10.0* 12.0  HCT 30.8* 37.3    Assessment/Plan: Status post Cesarean section. Doing well postoperatively.  Continue current care.  CURTIS,CAROL G 09/08/2011, 7:58 AM

## 2011-09-09 ENCOUNTER — Inpatient Hospital Stay (HOSPITAL_COMMUNITY)
Admission: AD | Admit: 2011-09-09 | Payer: Federal, State, Local not specified - PPO | Source: Ambulatory Visit | Admitting: Obstetrics and Gynecology

## 2011-09-09 ENCOUNTER — Encounter (HOSPITAL_COMMUNITY): Admission: AD | Payer: Self-pay | Source: Ambulatory Visit

## 2011-09-09 ENCOUNTER — Encounter (HOSPITAL_COMMUNITY): Payer: Self-pay | Admitting: Obstetrics and Gynecology

## 2011-09-09 LAB — RH IG WORKUP (INCLUDES ABO/RH): Unit division: 0

## 2011-09-09 SURGERY — Surgical Case
Anesthesia: Regional

## 2011-09-09 MED ORDER — OXYCODONE-ACETAMINOPHEN 5-325 MG/5ML PO SOLN: 5.0000 mL | ORAL | Status: DC | PRN

## 2011-09-09 MED ORDER — IBUPROFEN 100 MG/5ML PO SUSP: 600.0000 mg | Freq: Four times a day (QID) | ORAL | Status: DC

## 2011-09-09 NOTE — Discharge Summary (Signed)
Obstetric Discharge Summary Reason for Admission: rupture of membranes Prenatal Procedures: ultrasound Intrapartum Procedures: cesarean: low cervical, transverse Postpartum Procedures: none Complications-Operative and Postpartum: none Hemoglobin  Date Value Range Status  09/08/2011 10.0* 12.0 - 15.0 Glass/dL Final     HCT  Date Value Range Status  09/08/2011 30.8* 36.0 - 46.0 % Final    Physical Exam:  General: alert and cooperative Lochia: appropriate Uterine Fundus: firm Incision: healing well, staples intact DVT Evaluation: No evidence of DVT seen on physical exam.  Discharge Diagnoses: Term Pregnancy-delivered  Discharge Information: Date: 09/09/2011 Activity: pelvic rest Diet: routine Medications: PNV, Ibuprofen and Percocet Condition: stable Instructions: refer to practice specific booklet Discharge to: home   Newborn Data: Live born female  Birth Weight: 7 lb 9.7 oz (3450 Glass) APGAR: 9, 9  Home with mother.  Lori Glass 09/09/2011, 7:51 AM

## 2011-09-09 NOTE — Plan of Care (Signed)
Problem: Discharge Progression Outcomes Goal: Remove staples per MD order Outcome: Not Applicable Date Met:  09/09/11 Office removal on Monday 12th

## 2011-09-22 ENCOUNTER — Ambulatory Visit (HOSPITAL_COMMUNITY)
Admission: RE | Admit: 2011-09-22 | Discharge: 2011-09-22 | Disposition: A | Discharge status: Home / Self Care | Payer: Federal, State, Local not specified - PPO | Source: Ambulatory Visit | Attending: Obstetrics and Gynecology | Admitting: Obstetrics and Gynecology

## 2011-09-22 NOTE — Progress Notes (Signed)
Adult Lactation Consultation Outpatient Visit Note  Patient Name: Lori Glass                          Lori Glass, DOB [birth year], Birth Weight 7 lb. 32 oz, now 18 days old. Date of Birth: [birth year] Gestational Age at Delivery: Unknown Type of Delivery: C/S  Breastfeeding History: Frequency of Breastfeeding: at the breast 4-5 times during the day, cluster feeding at night Length of Feeding: 10-20 minutes Voids: 8 per day Stools: 8 per day, green to orange in color  Supplementing / Method: Pumping:  Type of Pump:  Harmony Hand Pump   Frequency:  Pre-pumping 20-45 minutes, or will pump the breast the Lori did not feed on.  Volume:  2 oz after pumping few times per day.   Comments: Mom is single parent living with her parents. Mom and her mom are here today for feeding assessment and advise on how to supplement the Lori at night. Lori Lori Glass is 38 days old and cluster feeding at night. Mom is getting no rest and her parents want to help with the night time feedings. Mom reports she is breast feeding Lori Ayer 4-5 times during the day for 10-20 minutes, then he cluster feeds at night. She has tried to pre-pump to obtain milk to supplement but is not getting but a small amount with pumping 20-45 minutes. Mom is very frustrated.    Consultation Evaluation: With this feeding assessment, Lori Ayer latches easily and demonstrates a good rhythmic suck with lots of swallows audible. Mom latched her Lori well, some small adjustment with the lower lip was all that was needed. He transferred milk well with breastfeeding from both breasts. After the feeding, mom demonstrated to me how she was using her hand pump which was not correct. Demonstrated how to use the pump properly and mom pumped approx 40 ml in 8 minutes. She was very relieved.   Initial Feeding Assessment: Pre-feed Weight:  8 lb. 4.5 oz/3756 gm. Post-feed Weight:  8 lb. 5. 2 oz/3776 gm Amount Transferred:  20 ml.  Comments:   From left breast  Additional Feeding Assessment: Pre-feed Weight:  8 lb. 5.2 oz/ 3776 gm Post-feed Weight:   8 lb. 8.5 oz/3870 gm Amount Transferred:  94 ml. Comments:  From right breast  Additional Feeding Assessment: Pre-feed Weight: Post-feed Weight: Amount Transferred: Comments:  Total Breast milk Transferred this Visit: 114 ml. Approx. 3.8 oz. Total Supplement Given: none  Additional Interventions: Plan for mom is to put the Lori to the breast more frequently during the day and see if this helps with his cluster feeding at night. She can pre or post pump with a few feeding during the day so if her parents want to feed the Lori for 1 feeding during the night they can help. She is fine with them using a bottle. Mom was happy with this plan.   Follow-Up  Prn.    Alfred Levins 09/22/2011, 3:22 PM

## 2012-06-17 IMAGING — US US OB COMP LESS 14 WK
1 series · 14 of 28 positions shown · non-contrast
Comparison: None.

The *RADIOLOGY REPORT*
CLINICAL DATA: Positive pregnancy test.  Vaginal bleeding.

OBSTETRIC <14 WK US AND TRANSVAGINAL OB US
TECHNIQUE: Both transabdominal and transvaginal ultrasound
examinations were performed for complete evaluation of the
gestation as well as the maternal uterus, adnexal regions, and
pelvic cul-de-sac.  Transvaginal technique was performed to assess
early pregnancy.

[Series 1: us ob comp less 14 wks · 14 of 38 slices shown]
[im 2/38]
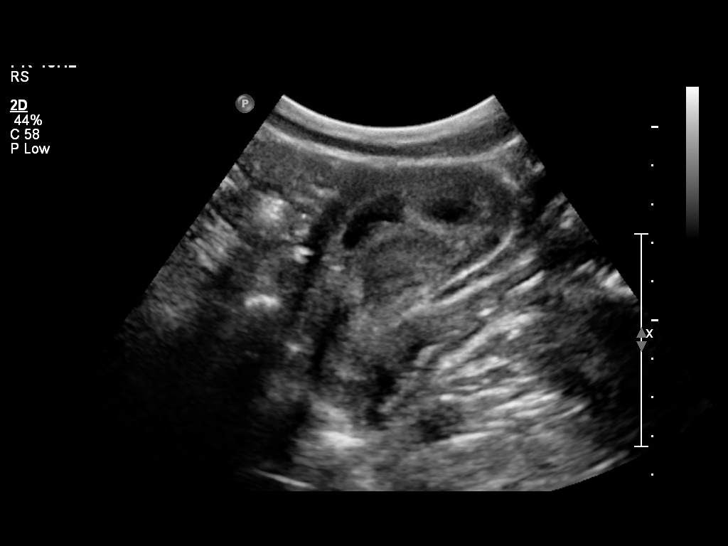
[im 5/38]
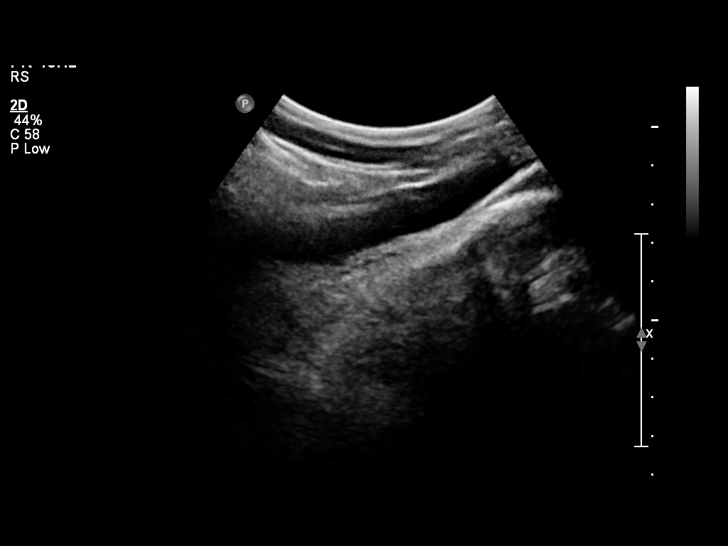
[im 7/38]
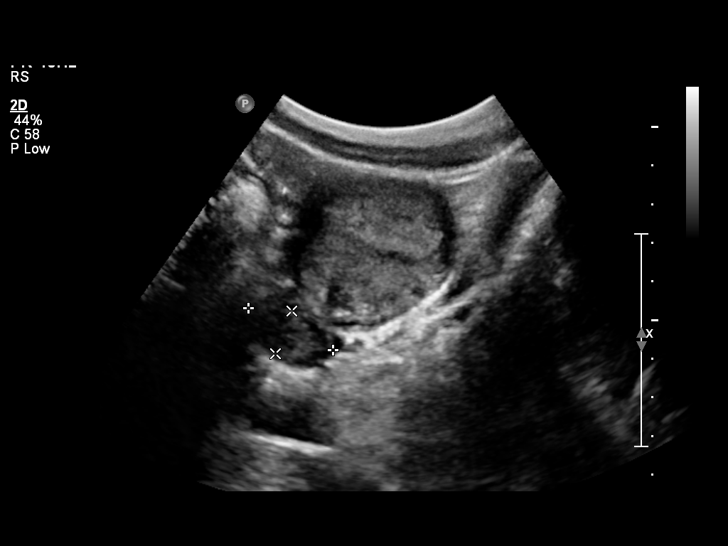
[im 10/38]
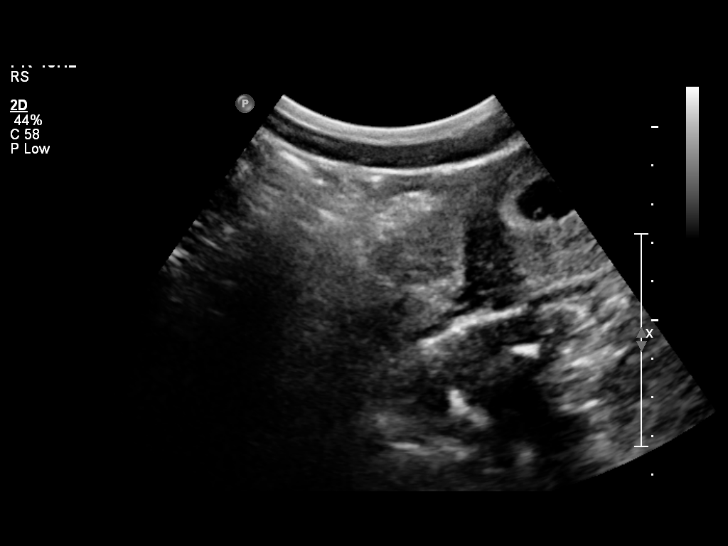
[im 13/38]
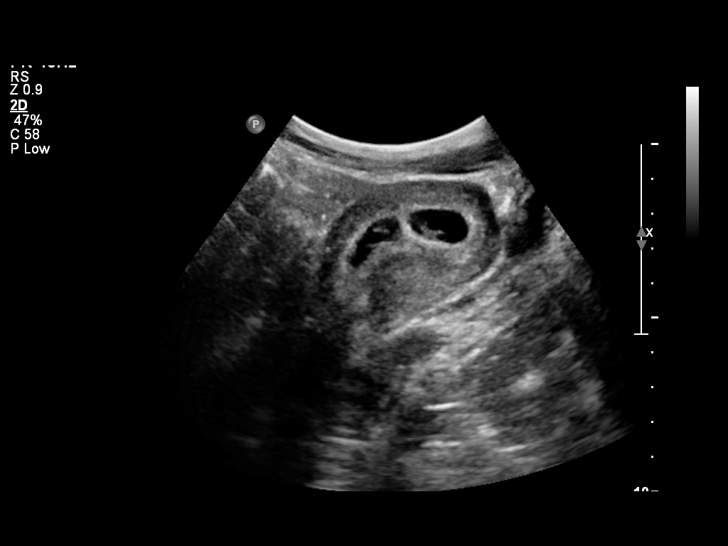
[im 16/38]
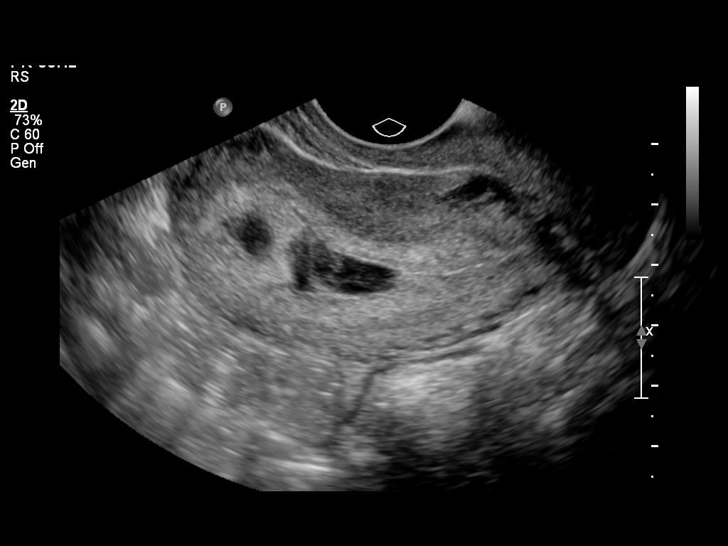
[im 18/38]
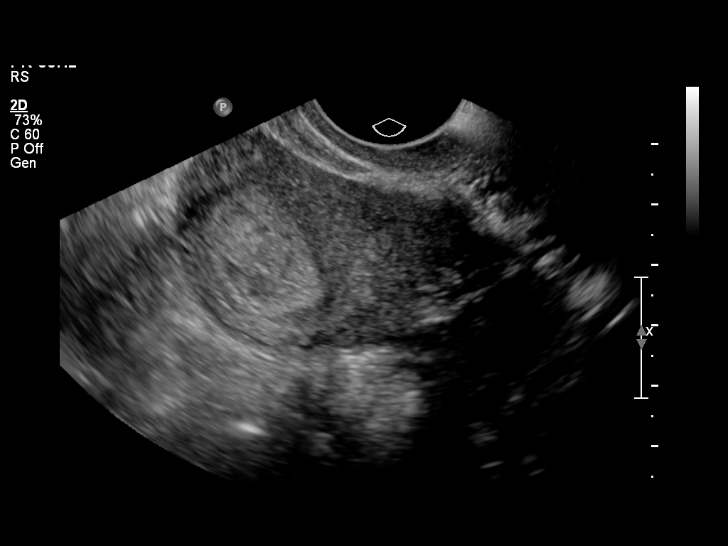
[im 21/38]
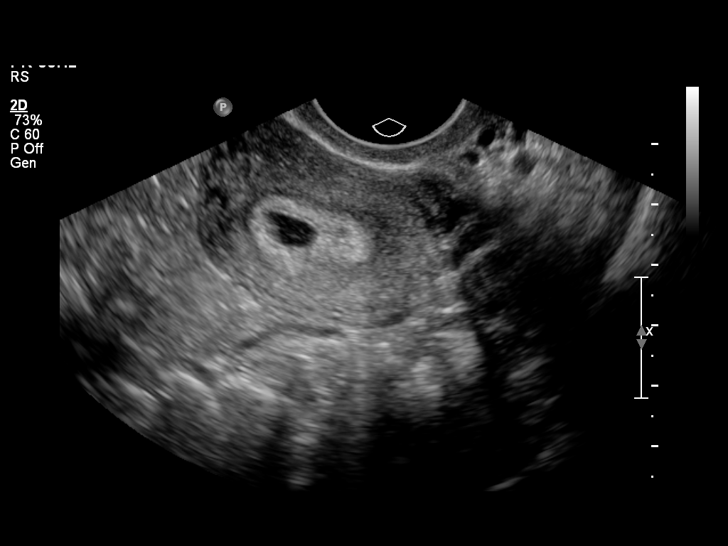
[im 24/38]
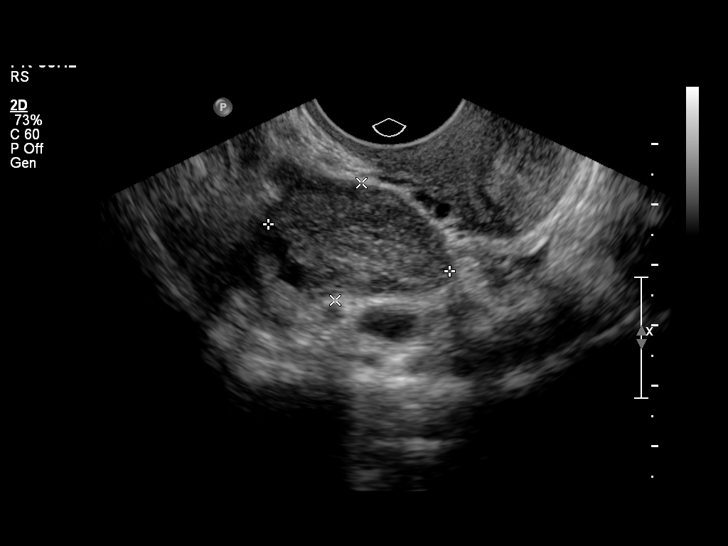
[im 27/38]
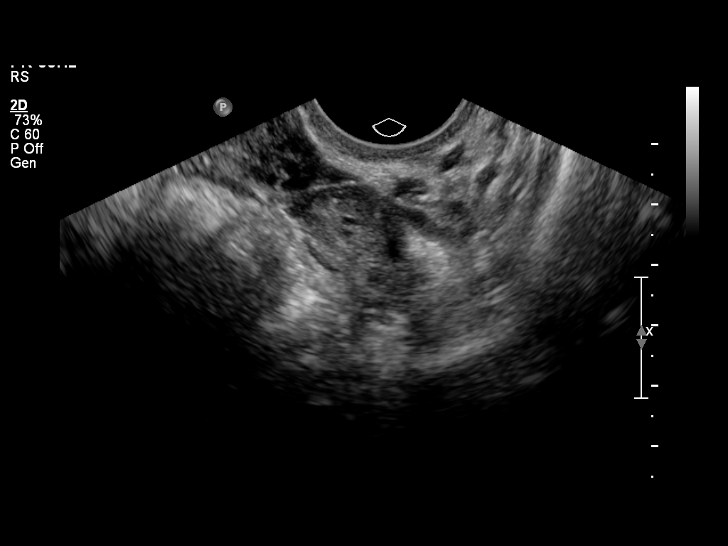
[im 29/38]
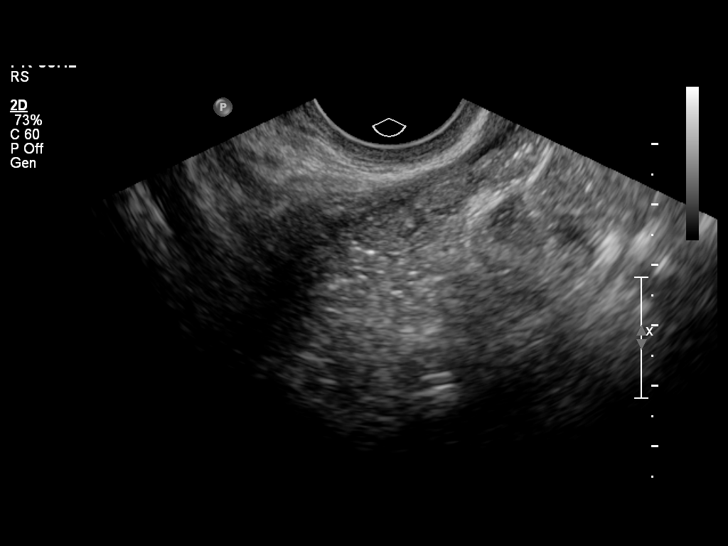
[im 32/38]
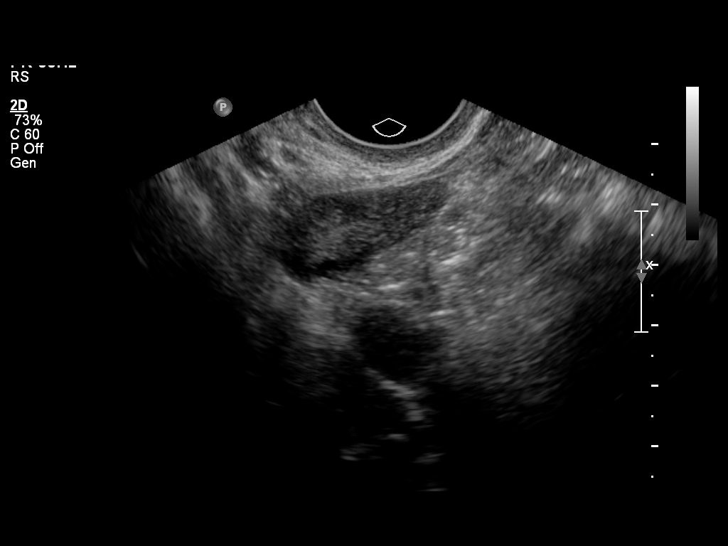
[im 35/38]
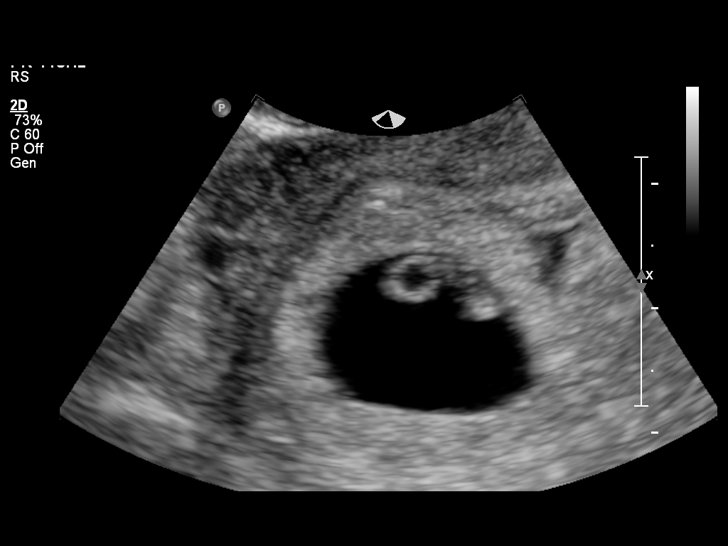
[im 38/38]
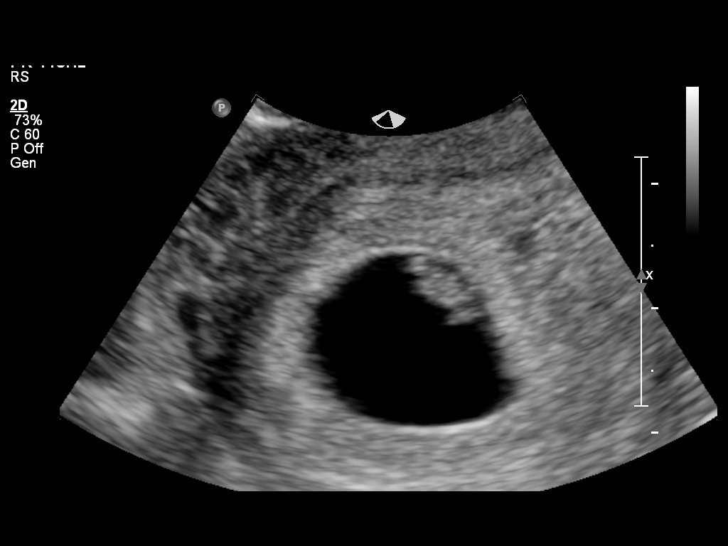

[14 of 28 positions shown; findings below may reference images not displayed]

Intrauterine gestational sac:  Single.
Yolk sac: Visualized
Embryo: Visualized
Cardiac Activity: Visualized
Heart Rate: 127 bpm

CRL: 6.9    mm  6   w  4   d        US EDC: 09/14/2011

Maternal uterus/adnexae:
 Small to moderate subchorionic hemorrhage is noted.  The maternal
ovaries are normal bilaterally.  No evidence for adnexal mass.  No
free fluid is noted in the cul-de-sac.
IMPRESSION: Single living intrauterine gestation at estimated 6-week-0-day
gestational age.  There is small to moderate subchorionic
hemorrhage associated.

## 2013-12-03 ENCOUNTER — Encounter (HOSPITAL_COMMUNITY): Payer: Self-pay | Admitting: Obstetrics and Gynecology

## 2014-02-01 NOTE — L&D Delivery Note (Cosign Needed)
Neonatology Note:   Attendance at C-section:    I was asked by Dr. Marcelle Overlie to attend this repeat C/S at term. The mother is a G2, P1, A neg, GBS neg with failure to progress. ROM with fluid clear. Infant vigorous with good spontaneous cry and tone. Needed only minimal bulb suctioning. Ap 9 and 9 at 1 and 5 minutes respectively.. Lungs clear to ausc in DR. To CN to care of Pediatrician.

## 2014-02-26 LAB — OB RESULTS CONSOLE GC/CHLAMYDIA
CHLAMYDIA, DNA PROBE: NEGATIVE
Chlamydia: NEGATIVE
Gonorrhea: NEGATIVE
Gonorrhea: NEGATIVE

## 2014-02-26 LAB — OB RESULTS CONSOLE HIV ANTIBODY (ROUTINE TESTING)
HIV: NONREACTIVE
HIV: NONREACTIVE

## 2014-02-26 LAB — OB RESULTS CONSOLE GBS
GBS: NEGATIVE
STREP GROUP B AG: NEGATIVE

## 2014-02-26 LAB — OB RESULTS CONSOLE RPR
RPR: NONREACTIVE
RPR: NONREACTIVE

## 2014-09-24 ENCOUNTER — Inpatient Hospital Stay (HOSPITAL_COMMUNITY)
Admission: AD | Admit: 2014-09-24 | Discharge: 2014-09-27 | DRG: 766 | Disposition: A | Discharge status: Home / Self Care | Payer: Federal, State, Local not specified - PPO | Source: Ambulatory Visit | Attending: Obstetrics and Gynecology | Admitting: Obstetrics and Gynecology

## 2014-09-24 ENCOUNTER — Encounter (HOSPITAL_COMMUNITY): Payer: Self-pay

## 2014-09-24 DIAGNOSIS — O480 Post-term pregnancy: Principal | ICD-10-CM | POA: Diagnosis present

## 2014-09-24 DIAGNOSIS — Z3490 Encounter for supervision of normal pregnancy, unspecified, unspecified trimester: Secondary | ICD-10-CM

## 2014-09-24 DIAGNOSIS — K219 Gastro-esophageal reflux disease without esophagitis: Secondary | ICD-10-CM | POA: Diagnosis present

## 2014-09-24 DIAGNOSIS — O3421 Maternal care for scar from previous cesarean delivery: Secondary | ICD-10-CM | POA: Diagnosis present

## 2014-09-24 DIAGNOSIS — O9962 Diseases of the digestive system complicating childbirth: Secondary | ICD-10-CM | POA: Diagnosis present

## 2014-09-24 DIAGNOSIS — Z8261 Family history of arthritis: Secondary | ICD-10-CM | POA: Diagnosis not present

## 2014-09-24 DIAGNOSIS — Z87891 Personal history of nicotine dependence: Secondary | ICD-10-CM

## 2014-09-24 DIAGNOSIS — Z3A40 40 weeks gestation of pregnancy: Secondary | ICD-10-CM | POA: Diagnosis present

## 2014-09-24 DIAGNOSIS — Z825 Family history of asthma and other chronic lower respiratory diseases: Secondary | ICD-10-CM | POA: Diagnosis not present

## 2014-09-24 LAB — CBC
HCT: 37.5 % (ref 36.0–46.0)
Hemoglobin: 12.3 g/dL (ref 12.0–15.0)
MCH: 27.8 pg (ref 26.0–34.0)
MCHC: 32.8 g/dL (ref 30.0–36.0)
MCV: 84.8 fL (ref 78.0–100.0)
PLATELETS: 205 10*3/uL (ref 150–400)
RBC: 4.42 MIL/uL (ref 3.87–5.11)
RDW: 15 % (ref 11.5–15.5)
WBC: 11.8 10*3/uL — AB (ref 4.0–10.5)

## 2014-09-24 LAB — TYPE AND SCREEN
ABO/RH(D): A NEG
ANTIBODY SCREEN: NEGATIVE

## 2014-09-24 LAB — RPR: RPR: NONREACTIVE

## 2014-09-24 MED ORDER — OXYTOCIN 40 UNITS IN LACTATED RINGERS INFUSION - SIMPLE MED
1.0000 m[IU]/min | INTRAVENOUS | Status: DC
  Administered 2014-09-24: 2 m[IU]/min via INTRAVENOUS
  Filled 2014-09-24: qty 1000

## 2014-09-24 MED ORDER — OXYCODONE-ACETAMINOPHEN 5-325 MG PO TABS: 2.0000 | ORAL_TABLET | ORAL | Status: DC | PRN

## 2014-09-24 MED ORDER — ONDANSETRON HCL 4 MG/2ML IJ SOLN: 4.0000 mg | Freq: Four times a day (QID) | INTRAMUSCULAR | Status: DC | PRN

## 2014-09-24 MED ORDER — LACTATED RINGERS IV SOLN: 500.0000 mL | INTRAVENOUS | Status: DC | PRN

## 2014-09-24 MED ORDER — LIDOCAINE HCL (PF) 1 % IJ SOLN: 30.0000 mL | INTRAMUSCULAR | Status: DC | PRN

## 2014-09-24 MED ORDER — CITRIC ACID-SODIUM CITRATE 334-500 MG/5ML PO SOLN
30.0000 mL | ORAL | Status: DC | PRN
  Administered 2014-09-25: 30 mL via ORAL
  Filled 2014-09-24: qty 15

## 2014-09-24 MED ORDER — FLEET ENEMA 7-19 GM/118ML RE ENEM: 1.0000 | ENEMA | RECTAL | Status: DC | PRN

## 2014-09-24 MED ORDER — OXYCODONE-ACETAMINOPHEN 5-325 MG PO TABS: 1.0000 | ORAL_TABLET | ORAL | Status: DC | PRN

## 2014-09-24 MED ORDER — OXYTOCIN 40 UNITS IN LACTATED RINGERS INFUSION - SIMPLE MED
1.0000 m[IU]/min | INTRAVENOUS | Status: DC
  Administered 2014-09-24: 1 m[IU]/min via INTRAVENOUS
  Filled 2014-09-24: qty 1000

## 2014-09-24 MED ORDER — TERBUTALINE SULFATE 1 MG/ML IJ SOLN: 0.2500 mg | Freq: Once | INTRAMUSCULAR | Status: DC | PRN

## 2014-09-24 MED ORDER — OXYTOCIN BOLUS FROM INFUSION: 500.0000 mL | INTRAVENOUS | Status: DC

## 2014-09-24 MED ORDER — ZOLPIDEM TARTRATE 5 MG PO TABS
5.0000 mg | ORAL_TABLET | Freq: Every evening | ORAL | Status: DC | PRN
  Administered 2014-09-24 (×2): 5 mg via ORAL
  Filled 2014-09-24 (×2): qty 1

## 2014-09-24 MED ORDER — ACETAMINOPHEN 325 MG PO TABS: 650.0000 mg | ORAL_TABLET | ORAL | Status: DC | PRN

## 2014-09-24 MED ORDER — LACTATED RINGERS IV SOLN
INTRAVENOUS | Status: DC
  Administered 2014-09-24: 125 mL/h via INTRAVENOUS
  Administered 2014-09-24: 20:00:00 via INTRAVENOUS
  Administered 2014-09-24: 125 mL/h via INTRAVENOUS
  Administered 2014-09-24 – 2014-09-25 (×3): via INTRAVENOUS

## 2014-09-24 MED ORDER — OXYTOCIN 40 UNITS IN LACTATED RINGERS INFUSION - SIMPLE MED: 62.5000 mL/h | INTRAVENOUS | Status: DC

## 2014-09-24 NOTE — Plan of Care (Signed)
Problem: Consults Goal: Birthing Suites Patient Information Press F2 to bring up selections list Outcome: Completed/Met Date Met:  09/24/14  Inpatient induction

## 2014-09-24 NOTE — Progress Notes (Signed)
Pt feeling mild discomfort with ctx.  No vb Requesting to eat  AF, VSS FHT cat 1 Abd - gravid, NT Cvx closed, softer.  Unable to place foley  A/P:  TOLAC, postdates Plan to d/c pitocin and allow to eat/shower Restart pitocin overnight

## 2014-09-24 NOTE — H&P (Signed)
Lori Glass is a 24 y.o. female G2P1 @ 40 wks presenting for IOL.  Pt with previous c-section, declines repeat c-section.  History OB History    Gravida Para Term Preterm AB TAB SAB Ectopic Multiple Living   Past Medical History  Diagnosis Date  . GERD (gastroesophageal reflux disease)     prilosec   Past Surgical History  Procedure Laterality Date  . No past surgeries    . Cesarean section  09/07/2011    Procedure: CESAREAN SECTION;  Surgeon: Juluis Mire, MD;  Location: [facility];  Service: Gynecology;  Laterality: N/A;  Primary cesarean section of baby boy at 81   APGAR 9/9   Family History: family history includes Arthritis in her mother; Asthma in her mother; Cancer in her maternal grandfather. There is no history of Anesthesia problems, Hypotension, Pseudochol deficiency, or Malignant hyperthermia. Social History:  reports that she quit smoking about 3 years ago. She does not have any smokeless tobacco history on file. She reports that she does not drink alcohol or use illicit drugs.   Prenatal Transfer Tool  Maternal Diabetes: No Genetic Screening: Declined Maternal Ultrasounds/Referrals: Normal Fetal Ultrasounds or other Referrals:  None Maternal Substance Abuse:  No Significant Maternal Medications:  None Significant Maternal Lab Results:  None Other Comments:  None  ROS  Dilation: Closed Effacement (%): Thick Exam by:: Dr Renaldo Fiddler Blood pressure 111/72, pulse 88, temperature 97.8 F (36.6 C), temperature source Oral, resp. rate 20, height  (1.6 m), weight 158 lb (71.668 kg), unknown if currently breastfeeding. Exam Physical Exam  Gen - NAD Abd - gravid, NT  EFW 7.5# Ext - NT, no edema Cvx closed Prenatal labs: ABO, Rh: --/--/A NEG (08/23 0120) Antibody: NEG (08/23 0120) Rubella:   RPR: Nonreactive, Nonreactive (01/26 0000)  HBsAg:    HIV: Non-reactive, Non-reactive (01/26 0000)  GBS: Negative, Negative (01/26 0000)    Assessment/Plan: TOLAC Pitocin IOL Epidural prn   Aizza Santiago 09/24/2014, 8:10 AM

## 2014-09-25 ENCOUNTER — Inpatient Hospital Stay (HOSPITAL_COMMUNITY): Payer: Federal, State, Local not specified - PPO | Admitting: Anesthesiology

## 2014-09-25 ENCOUNTER — Encounter (HOSPITAL_COMMUNITY)
Admission: AD | Disposition: A | Discharge status: Home / Self Care | Payer: Self-pay | Source: Ambulatory Visit | Attending: Obstetrics and Gynecology

## 2014-09-25 SURGERY — Surgical Case
Anesthesia: Spinal | Site: Abdomen

## 2014-09-25 MED ORDER — ONDANSETRON HCL 4 MG/2ML IJ SOLN
INTRAMUSCULAR | Status: DC | PRN
  Administered 2014-09-25: 4 mg via INTRAVENOUS

## 2014-09-25 MED ORDER — NALOXONE HCL 1 MG/ML IJ SOLN: 1.0000 ug/kg/h | INTRAVENOUS | Status: DC | PRN

## 2014-09-25 MED ORDER — SODIUM CHLORIDE 0.9 % IJ SOLN: 3.0000 mL | INTRAMUSCULAR | Status: DC | PRN

## 2014-09-25 MED ORDER — OXYCODONE-ACETAMINOPHEN 5-325 MG PO TABS
2.0000 | ORAL_TABLET | ORAL | Status: DC | PRN
  Administered 2014-09-27 (×2): 2 via ORAL
  Filled 2014-09-25 (×2): qty 2

## 2014-09-25 MED ORDER — MORPHINE SULFATE (PF) 0.5 MG/ML IJ SOLN
INTRAMUSCULAR | Status: DC | PRN
  Administered 2014-09-25: .1 mg via INTRATHECAL

## 2014-09-25 MED ORDER — SODIUM CHLORIDE 0.9 % IJ SOLN: 3.0000 mL | Freq: Two times a day (BID) | INTRAMUSCULAR | Status: DC

## 2014-09-25 MED ORDER — FENTANYL CITRATE (PF) 100 MCG/2ML IJ SOLN
INTRAMUSCULAR | Status: DC | PRN
  Administered 2014-09-25: 25 ug via INTRATHECAL

## 2014-09-25 MED ORDER — SIMETHICONE 80 MG PO CHEW
80.0000 mg | CHEWABLE_TABLET | ORAL | Status: DC
  Administered 2014-09-26 – 2014-09-27 (×2): 80 mg via ORAL
  Filled 2014-09-25 (×2): qty 1

## 2014-09-25 MED ORDER — PHENYLEPHRINE 8 MG IN D5W 100 ML (0.08MG/ML) PREMIX OPTIME
INJECTION | INTRAVENOUS | Status: AC
  Filled 2014-09-25: qty 100

## 2014-09-25 MED ORDER — DIPHENHYDRAMINE HCL 25 MG PO CAPS: 25.0000 mg | ORAL_CAPSULE | Freq: Four times a day (QID) | ORAL | Status: DC | PRN

## 2014-09-25 MED ORDER — SIMETHICONE 80 MG PO CHEW
80.0000 mg | CHEWABLE_TABLET | Freq: Three times a day (TID) | ORAL | Status: DC
  Administered 2014-09-26 – 2014-09-27 (×5): 80 mg via ORAL
  Filled 2014-09-25 (×5): qty 1

## 2014-09-25 MED ORDER — PRENATAL MULTIVITAMIN CH
1.0000 | ORAL_TABLET | Freq: Every day | ORAL | Status: DC
  Administered 2014-09-26 – 2014-09-27 (×2): 1 via ORAL
  Filled 2014-09-25 (×2): qty 1

## 2014-09-25 MED ORDER — SCOPOLAMINE 1 MG/3DAYS TD PT72
1.0000 | MEDICATED_PATCH | Freq: Once | TRANSDERMAL | Status: DC
  Administered 2014-09-25: 1.5 mg via TRANSDERMAL

## 2014-09-25 MED ORDER — MEASLES, MUMPS & RUBELLA VAC ~~LOC~~ INJ: 0.5000 mL | INJECTION | Freq: Once | SUBCUTANEOUS | Status: DC

## 2014-09-25 MED ORDER — MENTHOL 3 MG MT LOZG: 1.0000 | LOZENGE | OROMUCOSAL | Status: DC | PRN

## 2014-09-25 MED ORDER — DIPHENHYDRAMINE HCL 50 MG/ML IJ SOLN: 12.5000 mg | INTRAMUSCULAR | Status: DC | PRN

## 2014-09-25 MED ORDER — FLEET ENEMA 7-19 GM/118ML RE ENEM: 1.0000 | ENEMA | Freq: Every day | RECTAL | Status: DC | PRN

## 2014-09-25 MED ORDER — CEFAZOLIN SODIUM-DEXTROSE 2-3 GM-% IV SOLR
2.0000 g | Freq: Once | INTRAVENOUS | Status: DC
  Filled 2014-09-25: qty 50

## 2014-09-25 MED ORDER — SENNOSIDES-DOCUSATE SODIUM 8.6-50 MG PO TABS
2.0000 | ORAL_TABLET | ORAL | Status: DC
  Administered 2014-09-26 – 2014-09-27 (×2): 2 via ORAL
  Filled 2014-09-25 (×2): qty 2

## 2014-09-25 MED ORDER — ONDANSETRON HCL 4 MG/2ML IJ SOLN
INTRAMUSCULAR | Status: AC
  Filled 2014-09-25: qty 2

## 2014-09-25 MED ORDER — NALBUPHINE HCL 10 MG/ML IJ SOLN: 5.0000 mg | INTRAMUSCULAR | Status: DC | PRN

## 2014-09-25 MED ORDER — OXYTOCIN 10 UNIT/ML IJ SOLN
INTRAMUSCULAR | Status: AC
  Filled 2014-09-25: qty 4

## 2014-09-25 MED ORDER — ZOLPIDEM TARTRATE 5 MG PO TABS: 5.0000 mg | ORAL_TABLET | Freq: Every evening | ORAL | Status: DC | PRN

## 2014-09-25 MED ORDER — DIPHENHYDRAMINE HCL 25 MG PO CAPS
25.0000 mg | ORAL_CAPSULE | ORAL | Status: DC | PRN
  Filled 2014-09-25: qty 1

## 2014-09-25 MED ORDER — ONDANSETRON HCL 4 MG/2ML IJ SOLN: 4.0000 mg | Freq: Three times a day (TID) | INTRAMUSCULAR | Status: DC | PRN

## 2014-09-25 MED ORDER — MEPERIDINE HCL 25 MG/ML IJ SOLN: 6.2500 mg | INTRAMUSCULAR | Status: DC | PRN

## 2014-09-25 MED ORDER — WITCH HAZEL-GLYCERIN EX PADS: 1.0000 "application " | MEDICATED_PAD | CUTANEOUS | Status: DC | PRN

## 2014-09-25 MED ORDER — LANOLIN HYDROUS EX OINT: 1.0000 "application " | TOPICAL_OINTMENT | CUTANEOUS | Status: DC | PRN

## 2014-09-25 MED ORDER — NALBUPHINE HCL 10 MG/ML IJ SOLN: 5.0000 mg | Freq: Once | INTRAMUSCULAR | Status: DC | PRN

## 2014-09-25 MED ORDER — ACETAMINOPHEN 325 MG PO TABS: 650.0000 mg | ORAL_TABLET | ORAL | Status: DC | PRN

## 2014-09-25 MED ORDER — SCOPOLAMINE 1 MG/3DAYS TD PT72
MEDICATED_PATCH | TRANSDERMAL | Status: AC
  Administered 2014-09-25: 1.5 mg via TRANSDERMAL
  Filled 2014-09-25: qty 1

## 2014-09-25 MED ORDER — OXYTOCIN 40 UNITS IN LACTATED RINGERS INFUSION - SIMPLE MED: 62.5000 mL/h | INTRAVENOUS | Status: AC

## 2014-09-25 MED ORDER — KETOROLAC TROMETHAMINE 60 MG/2ML IM SOLN
INTRAMUSCULAR | Status: AC
  Administered 2014-09-25: 60 mg via INTRAMUSCULAR
  Filled 2014-09-25: qty 2

## 2014-09-25 MED ORDER — BUTORPHANOL TARTRATE 1 MG/ML IJ SOLN
1.0000 mg | Freq: Once | INTRAMUSCULAR | Status: AC
  Administered 2014-09-25: 1 mg via INTRAVENOUS
  Filled 2014-09-25: qty 1

## 2014-09-25 MED ORDER — SIMETHICONE 80 MG PO CHEW: 80.0000 mg | CHEWABLE_TABLET | ORAL | Status: DC | PRN

## 2014-09-25 MED ORDER — IBUPROFEN 800 MG PO TABS
800.0000 mg | ORAL_TABLET | Freq: Three times a day (TID) | ORAL | Status: DC | PRN
  Administered 2014-09-26 – 2014-09-27 (×4): 800 mg via ORAL
  Filled 2014-09-25 (×4): qty 1

## 2014-09-25 MED ORDER — NALOXONE HCL 0.4 MG/ML IJ SOLN: 0.4000 mg | INTRAMUSCULAR | Status: DC | PRN

## 2014-09-25 MED ORDER — DIBUCAINE 1 % RE OINT: 1.0000 "application " | TOPICAL_OINTMENT | RECTAL | Status: DC | PRN

## 2014-09-25 MED ORDER — MORPHINE SULFATE 0.5 MG/ML IJ SOLN
INTRAMUSCULAR | Status: AC
  Filled 2014-09-25: qty 100

## 2014-09-25 MED ORDER — SODIUM CHLORIDE 0.9 % IV SOLN: 250.0000 mL | INTRAVENOUS | Status: DC

## 2014-09-25 MED ORDER — LACTATED RINGERS IV SOLN
INTRAVENOUS | Status: DC | PRN
  Administered 2014-09-25 (×2): via INTRAVENOUS

## 2014-09-25 MED ORDER — EPHEDRINE 5 MG/ML INJ: 10.0000 mg | INTRAVENOUS | Status: DC | PRN

## 2014-09-25 MED ORDER — PHENYLEPHRINE 40 MCG/ML (10ML) SYRINGE FOR IV PUSH (FOR BLOOD PRESSURE SUPPORT): 80.0000 ug | PREFILLED_SYRINGE | INTRAVENOUS | Status: DC | PRN

## 2014-09-25 MED ORDER — FENTANYL 2.5 MCG/ML BUPIVACAINE 1/10 % EPIDURAL INFUSION (WH - ANES): 14.0000 mL/h | INTRAMUSCULAR | Status: DC | PRN

## 2014-09-25 MED ORDER — CEFAZOLIN SODIUM-DEXTROSE 2-3 GM-% IV SOLR
INTRAVENOUS | Status: DC | PRN
  Administered 2014-09-25: 2 g via INTRAVENOUS

## 2014-09-25 MED ORDER — KETOROLAC TROMETHAMINE 60 MG/2ML IM SOLN
60.0000 mg | Freq: Once | INTRAMUSCULAR | Status: AC
  Administered 2014-09-25: 60 mg via INTRAMUSCULAR

## 2014-09-25 MED ORDER — FENTANYL CITRATE (PF) 100 MCG/2ML IJ SOLN
INTRAMUSCULAR | Status: AC
  Filled 2014-09-25: qty 4

## 2014-09-25 MED ORDER — PHENYLEPHRINE 8 MG IN D5W 100 ML (0.08MG/ML) PREMIX OPTIME
INJECTION | INTRAVENOUS | Status: DC | PRN
  Administered 2014-09-25: 60 ug/min via INTRAVENOUS

## 2014-09-25 MED ORDER — OXYCODONE-ACETAMINOPHEN 5-325 MG PO TABS
1.0000 | ORAL_TABLET | ORAL | Status: DC | PRN
  Administered 2014-09-26 – 2014-09-27 (×2): 1 via ORAL
  Filled 2014-09-25 (×2): qty 1

## 2014-09-25 MED ORDER — BISACODYL 10 MG RE SUPP: 10.0000 mg | Freq: Every day | RECTAL | Status: DC | PRN

## 2014-09-25 MED ORDER — ACETAMINOPHEN 500 MG PO TABS
1000.0000 mg | ORAL_TABLET | Freq: Four times a day (QID) | ORAL | Status: AC
  Administered 2014-09-26 (×2): 1000 mg via ORAL
  Filled 2014-09-25 (×3): qty 2

## 2014-09-25 MED ORDER — TETANUS-DIPHTH-ACELL PERTUSSIS 5-2.5-18.5 LF-MCG/0.5 IM SUSP: 0.5000 mL | Freq: Once | INTRAMUSCULAR | Status: DC

## 2014-09-25 SURGICAL SUPPLY — 24 items
CLAMP CORD UMBIL (MISCELLANEOUS) ×3 IMPLANT
CLOSURE WOUND 1/2 X4 (GAUZE/BANDAGES/DRESSINGS) ×1
CLOTH BEACON ORANGE TIMEOUT ST (SAFETY) ×3 IMPLANT
DRAPE SHEET LG 3/4 BI-LAMINATE (DRAPES) IMPLANT
DRSG OPSITE POSTOP 4X10 (GAUZE/BANDAGES/DRESSINGS) ×3 IMPLANT
DURAPREP 26ML APPLICATOR (WOUND CARE) ×3 IMPLANT
ELECT REM PT RETURN 9FT ADLT (ELECTROSURGICAL) ×3
ELECTRODE REM PT RTRN 9FT ADLT (ELECTROSURGICAL) ×1 IMPLANT
EXTRACTOR VACUUM M CUP 4 TUBE (SUCTIONS) IMPLANT
EXTRACTOR VACUUM M CUP 4' TUBE (SUCTIONS)
GLOVE BIO SURGEON STRL SZ7 (GLOVE) ×3 IMPLANT
GOWN STRL REUS W/TWL LRG LVL3 (GOWN DISPOSABLE) ×6 IMPLANT
NEEDLE HYPO 25X5/8 SAFETYGLIDE (NEEDLE) ×3 IMPLANT
NS IRRIG 1000ML POUR BTL (IV SOLUTION) ×3 IMPLANT
PACK C SECTION WH (CUSTOM PROCEDURE TRAY) ×3 IMPLANT
PAD OB MATERNITY 4.3X12.25 (PERSONAL CARE ITEMS) ×3 IMPLANT
STRIP CLOSURE SKIN 1/2X4 (GAUZE/BANDAGES/DRESSINGS) ×2 IMPLANT
SUT CHROMIC 0 CTX 36 (SUTURE) ×9 IMPLANT
SUT MON AB 4-0 PS1 27 (SUTURE) ×3 IMPLANT
SUT PDS AB 0 CT1 27 (SUTURE) ×6 IMPLANT
SUT VIC AB 3-0 CT1 27 (SUTURE) ×4
SUT VIC AB 3-0 CT1 TAPERPNT 27 (SUTURE) ×2 IMPLANT
TOWEL OR 17X24 6PK STRL BLUE (TOWEL DISPOSABLE) ×3 IMPLANT
TRAY FOLEY CATH SILVER 14FR (SET/KITS/TRAYS/PACK) ×3 IMPLANT

## 2014-09-25 NOTE — Anesthesia Preprocedure Evaluation (Signed)
Anesthesia Evaluation  Patient identified by MRN, date of birth, ID band Patient awake    Reviewed: Allergy & Precautions, H&P , Patient's Chart, lab work & pertinent test results  Airway Mallampati: II  TM Distance: >3 FB Neck ROM: full    Dental no notable dental hx.    Pulmonary former smoker,  breath sounds clear to auscultation  Pulmonary exam normal       Cardiovascular Exercise Tolerance: Good Rhythm:regular Rate:Normal     Neuro/Psych    GI/Hepatic   Endo/Other    Renal/GU      Musculoskeletal   Abdominal   Peds  Hematology   Anesthesia Other Findings   Reproductive/Obstetrics                             Anesthesia Physical Anesthesia Plan  ASA: II  Anesthesia Plan: Spinal   Post-op Pain Management:    Induction:   Airway Management Planned:   Additional Equipment:   Intra-op Plan:   Post-operative Plan:   Informed Consent: I have reviewed the patients History and Physical, chart, labs and discussed the procedure including the risks, benefits and alternatives for the proposed anesthesia with the patient or authorized representative who has indicated his/her understanding and acceptance.   Dental Advisory Given  Plan Discussed with: CRNA  Anesthesia Plan Comments: (Lab work confirmed with CRNA in room. Platelets okay. Discussed spinal anesthetic, and patient consents to the procedure:  included risk of possible headache,backache, failed block, allergic reaction, and nerve injury. This patient was asked if she had any questions or concerns before the procedure started. )        Anesthesia Quick Evaluation  

## 2014-09-25 NOTE — Progress Notes (Signed)
cx closed on pit 34mu/min, stable FHR

## 2014-09-25 NOTE — Op Note (Signed)
Preoperative diagnosis: Previous cesarean section, failed induction  Postoperative diagnosis: Same  Procedure: Repeat low transverse cesarean section  Surgeon: Marcelle Overlie  Anesthesia: Spinal  EBL: 700 cc  Procedure and findings:  Patient taken the operating room after an adequate level of spinal and static was obtained the patient in left tilt position the abdomen prepped and draped in the usual fashion, Foley catheter positioned. Appropriate timeouts taken at that point. Transverse incision made excising the old scar this is carried down to the fascia which was incised and extended transversely. Rectus muscle divided in the midline peritoneum entered superiorly without incident and extended in a vertical fashion. The vesicouterine serosa was incised and the bladder was bluntly and sharply dissected below, bladder blade repositioned transverse incision made in the lower segment extended with blunt dissection thin meconium was noted the patient in the patient then delivered of a vigorous infant, the infant was suctioned cord clamped and passed the pediatric team for further care. The placenta was then delivered manually intact, handed off for cord blood specimens and cord blood sampling. Uterus exteriorized cavity wiped clean with a laparotomy pack closure transversely of 0 chromic in a locked fashion followed by an imbricating layer of 0 chromic. This is hemostatic the bladder flap area was intact and hemostatic bilateral tubes and ovaries were normal. Prior to closure sponge, needle, instrument counts reported as correct 2. Peritoneum was closed with 30 Vicryls running suture as was the rectus muscles in the midline 0 PDS from laterally to midline on either side to close the fascia subcutaneous tissue was undermined to reduce tension this was irrigated and made hemostatic with the Bovie 4-0 Monocryl subcuticular skin closure with excellent approximation. Clear urine noted at the end the case she  tolerated this well went to recovery room in good condition.  Dictated with [facility]  Lori Glass Lori Glass M.D.

## 2014-09-25 NOTE — Anesthesia Postprocedure Evaluation (Signed)
  Anesthesia Post-op Note  Patient: Lori Glass  Procedure(s) Performed: Procedure(s): CESAREAN SECTION (N/A)  Patient is awake, responsive, moving her legs, and has signs of resolution of her numbness. Pain and nausea are reasonably well controlled. Vital signs are stable and clinically acceptable. Oxygen saturation is clinically acceptable. There are no apparent anesthetic complications at this time. Patient is ready for discharge.

## 2014-09-25 NOTE — Anesthesia Procedure Notes (Addendum)
Spinal Patient location during procedure: OR Preanesthetic Checklist Completed: patient identified, site marked, surgical consent, pre-op evaluation, timeout performed, IV checked, risks and benefits discussed and monitors and equipment checked Spinal Block Patient position: sitting Prep: DuraPrep Patient monitoring: heart rate, cardiac monitor, continuous pulse ox and blood pressure Approach: midline Location: L3-4 Injection technique: single-shot Needle Needle type: Sprotte  Needle gauge: 24 G Needle length: 9 cm Assessment Sensory level: T4 Additional Notes Transient R paresthesia....  Cleared before SAB injection Spinal Dosage in OR  Bupivicaine ml       1.3 PFMS04   mcg        100 Fentanyl mcg            25

## 2014-09-25 NOTE — Progress Notes (Signed)
cx still closed, she does not want to pursue cont TOL, discussed RCS/proced + risks

## 2014-09-25 NOTE — Transfer of Care (Signed)
Immediate Anesthesia Transfer of Care Note  Patient: Lori Glass  Procedure(s) Performed: Procedure(s): CESAREAN SECTION (N/A)  Patient Location: PACU  Anesthesia Type:Spinal  Level of Consciousness: awake, alert  and oriented  Airway & Oxygen Therapy: Patient Spontanous Breathing  Post-op Assessment: Report given to RN and Post -op Vital signs reviewed and stable  Post vital signs: Reviewed and stable  Last Vitals:  Filed Vitals:   09/25/14 1331  BP: 105/73  Pulse: 77  Temp:   Resp: 20    Complications: No apparent anesthesia complications

## 2014-09-26 ENCOUNTER — Encounter (HOSPITAL_COMMUNITY): Payer: Self-pay | Admitting: *Deleted

## 2014-09-26 LAB — CBC
HEMATOCRIT: 32.5 % — AB (ref 36.0–46.0)
Hemoglobin: 10.4 g/dL — ABNORMAL LOW (ref 12.0–15.0)
MCH: 27.4 pg (ref 26.0–34.0)
MCHC: 32 g/dL (ref 30.0–36.0)
MCV: 85.8 fL (ref 78.0–100.0)
Platelets: 166 10*3/uL (ref 150–400)
RBC: 3.79 MIL/uL — AB (ref 3.87–5.11)
RDW: 15.5 % (ref 11.5–15.5)
WBC: 11.6 10*3/uL — AB (ref 4.0–10.5)

## 2014-09-26 NOTE — Progress Notes (Signed)
MOB was referred for history of depression/anxiety.  Referral is screened out by Clinical Social Worker because none of the following criteria appear to apply: -History of anxiety/depression during this pregnancy, or of post-partum depression. - Diagnosis of anxiety and/or depression within last 3 years or -MOB's symptoms are currently being treated with medication and/or therapy.  Per chart review, MOB presents with history of depression and received treatment for symptoms. No concerns noted in chart.   Please contact the Clinical Social Worker if needs arise or upon MOB request.

## 2014-09-26 NOTE — Addendum Note (Signed)
Addendum  created 09/26/14 1478 by Armanda Heritage, CRNA   Modules edited: Notes Section   Notes Section:  File: [id number]

## 2014-09-26 NOTE — Progress Notes (Signed)
Subjective: Postpartum Day 1: Cesarean Delivery Patient reports tolerating PO.    Objective: Vital signs in last 24 hours: Temp:  [97.4 F (36.3 C)-98.6 F (37 C)] 98.3 F (36.8 C) (08/25 0620) Pulse Rate:  [55-82] 63 (08/25 0620) Resp:  [15-23] 18 (08/25 0620) BP: (95-134)/(52-83) 108/54 mmHg (08/25 0620) SpO2:  [96 %-100 %] 98 % (08/25 7829)  Physical Exam:  General: alert and cooperative Lochia: appropriate Uterine Fundus: firm Incision: healing well DVT Evaluation: No evidence of DVT seen on physical exam. Negative Homan's sign. No cords or calf tenderness. No significant calf/ankle edema.   Recent Labs  09/24/14 0105 09/26/14 0527  HGB 12.3 10.4*  HCT 37.5 32.5*    Assessment/Plan: Status post Cesarean section. Doing well postoperatively.  Continue current care.  CURTIS,CAROL G 09/26/2014, 8:28 AM

## 2014-09-26 NOTE — Anesthesia Postprocedure Evaluation (Signed)
  Anesthesia Post-op Note  Patient: Lori Glass  Procedure(s) Performed: Procedure(s): CESAREAN SECTION (N/A)  Patient Location: PACU  Anesthesia Type:Spinal  Level of Consciousness: awake, alert  and oriented  Airway and Oxygen Therapy: Patient Spontanous Breathing  Post-op Pain: mild  Post-op Assessment: Post-op Vital signs reviewed LLE Motor Response: Purposeful movement LLE Sensation: Tingling RLE Motor Response: Purposeful movement RLE Sensation: Tingling L Sensory Level: S1-Sole of foot, small toes R Sensory Level: S1-Sole of foot, small toes  Post-op Vital Signs: Reviewed and stable  Last Vitals:  Filed Vitals:   09/26/14 0620  BP: 108/54  Pulse: 63  Temp: 36.8 C  Resp: 18    Complications: No apparent anesthesia complications

## 2014-09-26 NOTE — Lactation Note (Signed)
This note was copied from the chart of Lori Lesbia Ottaway. Lactation Consultation Note: Lactation Brochure given with basic review of all teaching. Mother states that she breastfed her first child for 6 months. Mother states that infant is cluster feeding and she is getting sore. Mother was given comfort gels. Tissue is slightly pink but intact. Mother states she will call for assistance if needed.  Patient Name: Lori Glass Today's Date: 09/26/2014 Reason for consult: Initial assessment   Maternal Data Has patient been taught Hand Expression?: Yes Does the patient have breastfeeding experience prior to this delivery?: Yes  Feeding    LATCH Score/Interventions                      Lactation Tools Discussed/Used     Consult Status Consult Status: Follow-up Date: 09/26/14 Follow-up type: In-patient    [facility] 09/26/2014, 5:12 PM

## 2014-09-27 LAB — BIRTH TISSUE RECOVERY COLLECTION (PLACENTA DONATION)

## 2014-09-27 MED ORDER — IBUPROFEN 800 MG PO TABS: 800.0000 mg | ORAL_TABLET | Freq: Three times a day (TID) | ORAL | Status: DC | PRN

## 2014-09-27 MED ORDER — RHO D IMMUNE GLOBULIN 1500 UNIT/2ML IJ SOSY
300.0000 ug | PREFILLED_SYRINGE | Freq: Once | INTRAMUSCULAR | Status: AC
  Administered 2014-09-27: 300 ug via INTRAMUSCULAR
  Filled 2014-09-27: qty 2

## 2014-09-27 MED ORDER — OXYCODONE-ACETAMINOPHEN 5-325 MG PO TABS: 1.0000 | ORAL_TABLET | ORAL | Status: DC | PRN

## 2014-09-27 NOTE — Discharge Summary (Signed)
Obstetric Discharge Summary Reason for Admission: induction of labor Prenatal Procedures: none Intrapartum Procedures: cesarean: low cervical, transverse Postpartum Procedures: none Complications-Operative and Postpartum: none HEMOGLOBIN  Date Value Ref Range Status  09/26/2014 10.4* 12.0 - 15.0 g/dL Final   HCT  Date Value Ref Range Status  09/26/2014 32.5* 36.0 - 46.0 % Final    Physical Exam:  General: alert, cooperative and appears stated age 24: appropriate Uterine Fundus: firm Incision: healing well, no significant drainage, no dehiscence, no significant erythema DVT Evaluation: No evidence of DVT seen on physical exam.  Discharge Diagnoses: Term Pregnancy-delivered and Failed induction  Discharge Information: Date: 09/27/2014 Activity: pelvic rest Diet: routine Medications: Ibuprofen and Percocet Condition: improved Instructions: refer to practice specific booklet Discharge to: home   Newborn Data: Live born female  Birth Weight: 8 lb 2.3 oz (3694 g) APGAR: 9, 9  Home with mother.  Lori Glass L 09/27/2014, 8:08 AM

## 2014-09-28 LAB — RH IG WORKUP (INCLUDES ABO/RH)
ABO/RH(D): A NEG
FETAL SCREEN: NEGATIVE
GESTATIONAL AGE(WKS): 40.3
Unit division: 0

## 2019-03-27 ENCOUNTER — Encounter (HOSPITAL_BASED_OUTPATIENT_CLINIC_OR_DEPARTMENT_OTHER): Payer: Self-pay | Admitting: *Deleted

## 2019-03-27 ENCOUNTER — Other Ambulatory Visit: Payer: Self-pay

## 2019-03-27 ENCOUNTER — Emergency Department (HOSPITAL_BASED_OUTPATIENT_CLINIC_OR_DEPARTMENT_OTHER): Payer: Self-pay

## 2019-03-27 ENCOUNTER — Emergency Department (HOSPITAL_BASED_OUTPATIENT_CLINIC_OR_DEPARTMENT_OTHER)
Admission: EM | Admit: 2019-03-27 | Discharge: 2019-03-27 | Disposition: A | Discharge status: Home / Self Care | Payer: Self-pay | Attending: Emergency Medicine | Admitting: Emergency Medicine

## 2019-03-27 DIAGNOSIS — Z87891 Personal history of nicotine dependence: Secondary | ICD-10-CM | POA: Insufficient documentation

## 2019-03-27 DIAGNOSIS — M79671 Pain in right foot: Secondary | ICD-10-CM | POA: Insufficient documentation

## 2019-03-27 MED ORDER — NAPROXEN 500 MG PO TABS: 500.0000 mg | ORAL_TABLET | Freq: Two times a day (BID) | ORAL | 0 refills | Status: AC

## 2019-03-27 MED ORDER — KETOROLAC TROMETHAMINE 60 MG/2ML IM SOLN
30.0000 mg | Freq: Once | INTRAMUSCULAR | Status: AC
  Administered 2019-03-27: 30 mg via INTRAMUSCULAR
  Filled 2019-03-27: qty 2

## 2019-03-27 NOTE — Discharge Instructions (Signed)
Please read and follow all provided instructions.  You have been seen today for right foot pain.   Tests performed today include: An x-ray of the affected area - does NOT show any broken bones or dislocations.  Vital signs. See below for your results today.   Home care instructions: Please ask the pharmacist or staff at pharmacy to assist you with finding heel cups to try in your shoes to help with possible plantar fascitis, try to rest, keep the foot elevated when possible.   Medications:  - Naproxen is a nonsteroidal anti-inflammatory medication that will help with pain and swelling. Be sure to take this medication as prescribed with food, 1 pill every 12 hours,  It should be taken with food, as it can cause stomach upset, and more seriously, stomach bleeding. Do not take other nonsteroidal anti-inflammatory medications with this such as Advil, Motrin, Aleve, Mobic, Goodie Powder, or Motrin.  Do not start taking this until tonight as you were given a shot in the ER similar to this.   You make take Tylenol per over the counter dosing with these medications.   We have prescribed you new medication(s) today. Discuss the medications prescribed today with your pharmacist as they can have adverse effects and interactions with your other medicines including over the counter and prescribed medications. Seek medical evaluation if you start to experience new or abnormal symptoms after taking one of these medicines, seek care immediately if you start to experience difficulty breathing, feeling of your throat closing, facial swelling, or rash as these could be indications of a more serious allergic reaction   Follow-up instructions: Please follow-up with your primary care provider or the provided orthopedic physician (bone specialist) if you continue to have significant pain in 1 week. In this case you may have a more severe injury that requires further care.   Return instructions:  Please return if your  digits or extremity are numb or tingling, appear gray or blue, or you have severe pain (also elevate the extremity and loosen splint or wrap if you were given one) Please return if you have redness or fevers.  Please return to the Emergency Department if you experience worsening symptoms.  Please return if you have any other emergent concerns. Additional Information:  Your vital signs today were: BP 137/87 (BP Location: Right Arm)    Pulse (!) 102    Temp 98.7 F (37.1 C) (Oral)    Resp 18    Ht 5\' 3"  (1.6 m)    Wt 70.3 kg    SpO2 100%    BMI 27.46 kg/m  If your blood pressure (BP) was elevated above 135/85 this visit, please have this repeated by your doctor within one month. ---------------

## 2019-03-27 NOTE — ED Triage Notes (Signed)
Presents with rt foot pain , pain primarily at lateral aspect radiating to posterior portion of foot, denies any injury to rt foot

## 2019-03-27 NOTE — ED Provider Notes (Signed)
[facility] EMERGENCY DEPARTMENT Provider Note   CSN: [id number] Arrival date & time: 03/27/19  3614     History Chief Complaint  Patient presents with  . Foot Pain    Lori Glass is a 29 y.o. female with a history of GERD who presents to the ED with complaints of R foot pain x 1 week. Patient reports pain is to the lateral & plantar aspect of the foot, it is constant, throbbing, worse with when she first walks in the AM and with any weightbearing, some alleviating with application of ice, no change with ibuprofen. She states she started a new job and had been wearing healed boots and thinks she stepped wrong one day, but no other injury. Denies numbness, weakness, fever, chills, wounds, change in color or other area of pain. Denies pregnancy.  HPI     Past Medical History:  Diagnosis Date  . GERD (gastroesophageal reflux disease)    prilosec    Patient Active Problem List   Diagnosis Date Noted  . Pregnancy 09/24/2014    Past Surgical History:  Procedure Laterality Date  . CESAREAN SECTION  09/07/2011   Procedure: CESAREAN SECTION;  Surgeon: Juluis Mire, MD;  Location: [facility];  Service: Gynecology;  Laterality: N/A;  Primary cesarean section of baby boy at 0140   APGAR 9/9  . CESAREAN SECTION N/A 09/25/2014   Procedure: CESAREAN SECTION;  Surgeon: Richarda Overlie, MD;  Location: [facility];  Service: Obstetrics;  Laterality: N/A;  . NO PAST SURGERIES       OB History    Gravida  2   Para  2   Term  2   Preterm      AB      Living  2     SAB      TAB      Ectopic      Multiple  0   Live Births  2           Family History  Problem Relation Age of Onset  . Arthritis Mother   . Asthma Mother   . Cancer Maternal Grandfather   . Anesthesia problems Neg Hx   . Hypotension Neg Hx   . Pseudochol deficiency Neg Hx   . Malignant hyperthermia Neg Hx     Social History   Tobacco Use  . Smoking status: Former Smoker    Quit date:  01/02/2011    Years since quitting: 8.2  . Smokeless tobacco: Never Used  Substance Use Topics  . Alcohol use: No  . Drug use: No    Home Medications Prior to Admission medications   Medication Sig Start Date End Date Taking? Authorizing Provider  ibuprofen (ADVIL,MOTRIN) 800 MG tablet Take 1 tablet (800 mg total) by mouth every 8 (eight) hours as needed for moderate pain. 09/27/14   Marcelle Overlie, MD  oxyCODONE-acetaminophen (PERCOCET/ROXICET) 5-325 MG per tablet Take 1 tablet by mouth every 4 (four) hours as needed (for pain scale 4-7). 09/27/14   Marcelle Overlie, MD  oxyCODONE-acetaminophen (ROXICET) 5-325 MG/5ML solution Take 5-10 mLs by mouth every 4 (four) hours as needed. Patient not taking: Reported on 09/24/2014 09/09/11   Julio Sicks, NP  Prenatal Vit-Fe Fumarate-FA (PRENATAL MULTIVITAMIN) TABS tablet Take 1 tablet by mouth daily at 12 noon.    [provider]    Allergies    Patient has no known allergies.  Review of Systems   Review of Systems  Constitutional: Negative for chills and  fever.  Respiratory: Negative for shortness of breath.   Cardiovascular: Negative for chest pain.  Gastrointestinal: Negative for abdominal pain.  Musculoskeletal: Positive for arthralgias.  Skin: Negative for color change, rash and wound.  Neurological: Negative for weakness and numbness.    Physical Exam Updated Vital Signs BP 137/87 (BP Location: Right Arm)   Pulse (!) 102   Temp 98.7 F (37.1 C) (Oral)   Resp 18   Ht 5\' 3"  (1.6 m)   Wt 70.3 kg   SpO2 100%   BMI 27.46 kg/m   Physical Exam Vitals and nursing note reviewed.  Constitutional:      General: She is not in acute distress.    Appearance: She is not ill-appearing or toxic-appearing.  HENT:     Head: Normocephalic and atraumatic.  Cardiovascular:     Rate and Rhythm: Normal rate.     Pulses:          Dorsalis pedis pulses are 2+ on the right side and 2+ on the left side.       Posterior tibial pulses  are 2+ on the right side and 2+ on the left side.  Pulmonary:     Effort: Pulmonary effort is normal.  Musculoskeletal:     Comments: Lower extremities: No obvious deformity, appreciable swelling, edema, erythema, ecchymosis, warmth, or open wounds. Patient has intact AROM to bilateral hips, knees, ankles, and all digits. Tender to palpation to the plantar fascia and to the lateral mid/forefoot of the RLE. Otherwise nontender. No malleolar tenderness. Compartments are soft. NVI distally. Some pain with active/passive dorsiflexion of the right ankle, but intact.   Skin:    General: Skin is warm and dry.     Capillary Refill: Capillary refill takes less than 2 seconds.  Neurological:     Mental Status: She is alert.     Comments: Alert. Clear speech. Sensation grossly intact to bilateral lower extremities. 5/5 strength with plantar/dorsiflexion bilaterally. Patient ambulatory with antalgic gait.   Psychiatric:        Mood and Affect: Mood normal.        Behavior: Behavior normal.     ED Results / Procedures / Treatments   Labs (all labs ordered are listed, but only abnormal results are displayed) Labs Reviewed - No data to display  EKG None  Radiology DG Foot Complete Right  Result Date: 03/27/2019 CLINICAL DATA:  Pain in bottom of foot, no known injury EXAM: RIGHT FOOT COMPLETE - 3+ VIEW COMPARISON:  None. FINDINGS: There is no evidence of fracture or dislocation. There is no evidence of arthropathy or other focal bone abnormality. Soft tissues are unremarkable. IMPRESSION: No fracture or dislocation of the right foot. Joint spaces are well preserved. Electronically Signed   By: Eddie Candle M.D.   On: 03/27/2019 10:46    Procedures Procedures (including critical care time)  Medications Ordered in ED Medications  ketorolac (TORADOL) injection 30 mg (has no administration in time range)    ED Course  I have reviewed the triage vital signs and the nursing notes.  Pertinent labs  & imaging results that were available during my care of the patient were reviewed by me and considered in my medical decision making (see chart for details).    MDM Rules/Calculators/A&P                      Patient presents to the ED with complaints of R foot pain x 1 week.  Nontoxic, vitals  without significant abnormality.  Reviewed nursing notes and prior charts for additional history.  No erythema/warmth/rashes- not consistent w/ infectious process such as cellulitis or septic joint. Xray w/o fracture/dislocation or other acute abnormality, I have personally reviewed imaging and am in agreement with radiologist interpretation. Sxs well localized to the foot, no edema, do not suspect DVT. Unclear definitive etiology, question plantar fascitis given worse with first weightbear and with dorsiflexion. Will give toradol in the ED and discharge home with naproxen, recommended heel cups, sports medicine follow up. I discussed results, treatment plan, need for follow-up, and return precautions with the patient. Provided opportunity for questions, patient confirmed understanding and is in agreement with plan.   Final Clinical Impression(s) / ED Diagnoses Final diagnoses:  Right foot pain    Rx / DC Orders ED Discharge Orders         Ordered    naproxen (NAPROSYN) 500 MG tablet  2 times daily     03/27/19 [address], [place], PA-C 03/27/19 1114    Cathren Laine, MD 03/27/19 1207

## 2020-08-19 IMAGING — CR DG FOOT COMPLETE 3+V*R*
3 series · 3 of 3 positions shown · non-contrast
Comparison: None.

CLINICAL DATA: Pain in bottom of foot, no known injury

EXAM:
RIGHT FOOT COMPLETE - 3+ VIEW

[t foot ap right]
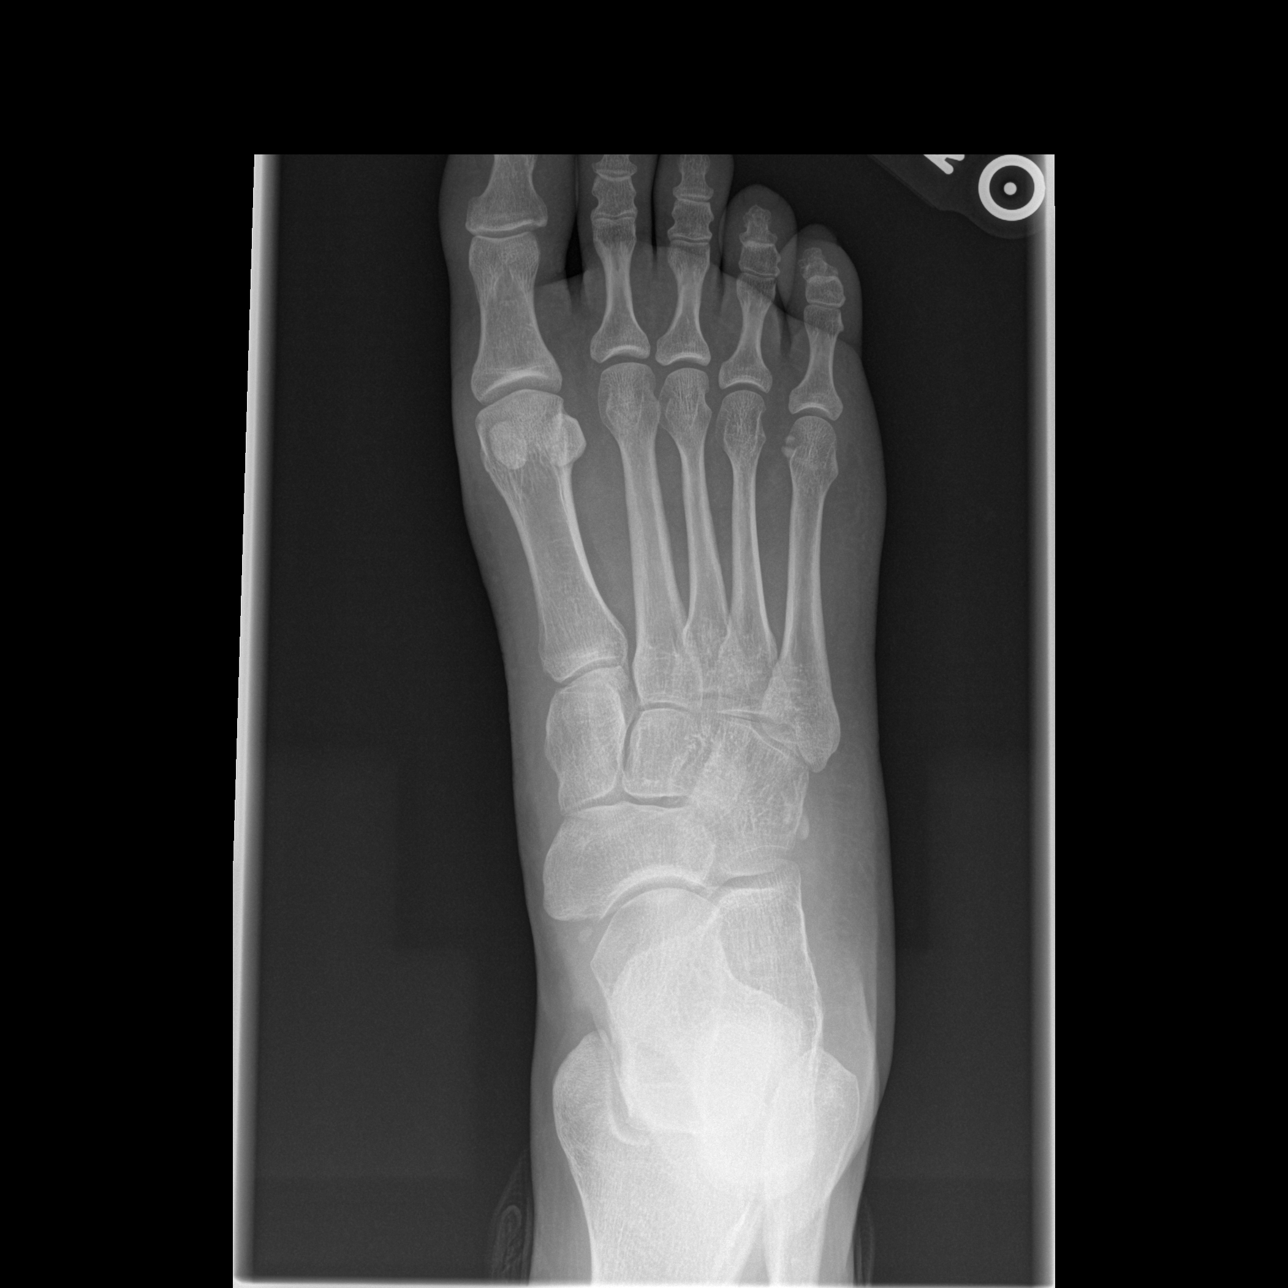

[t foot oblique right]
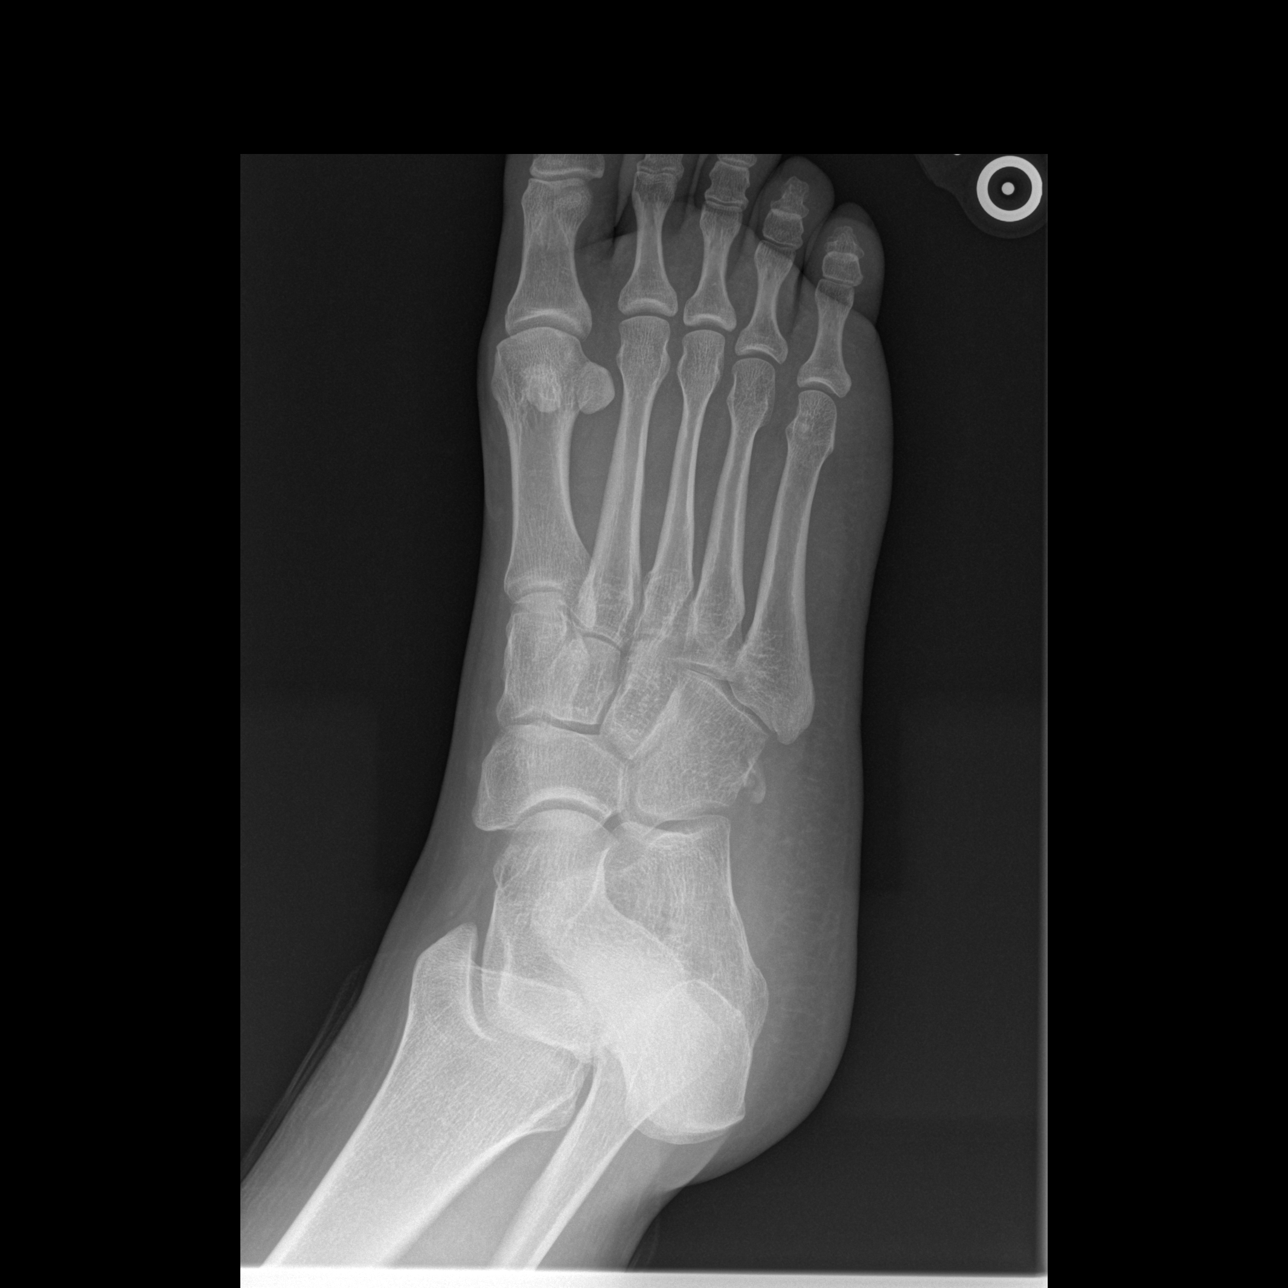

[t foot lat right]
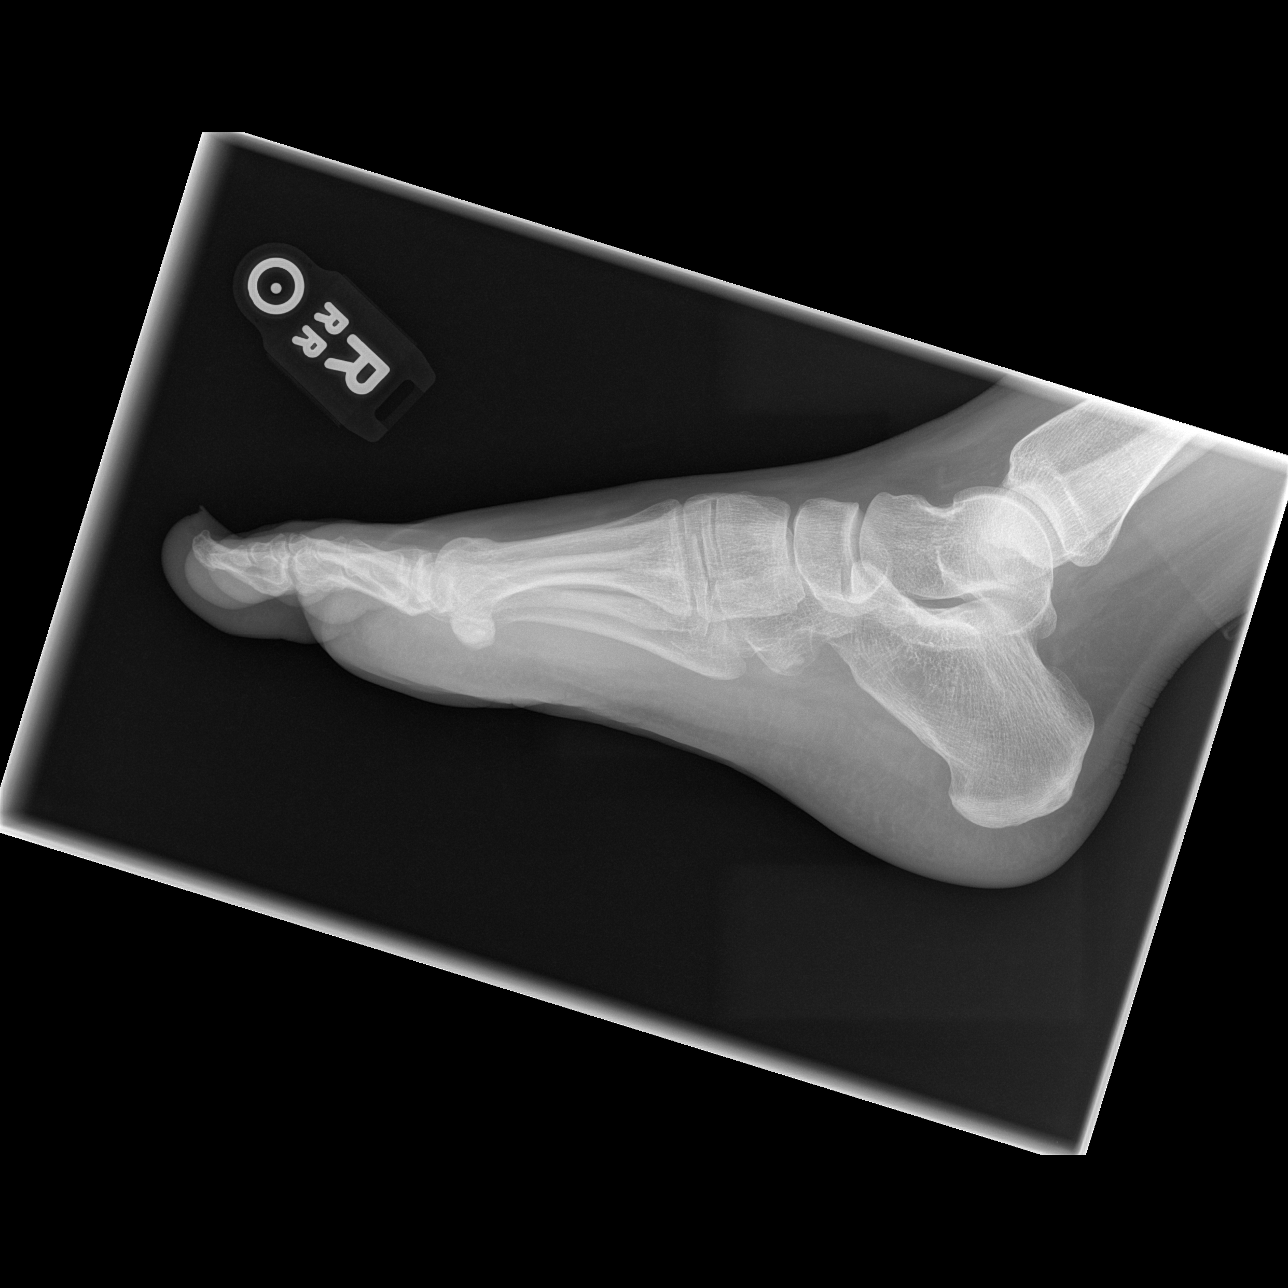

[3 of 3 positions shown; findings below may reference images not displayed]

FINDINGS: There is no evidence of fracture or dislocation. There is no
evidence of arthropathy or other focal bone abnormality. Soft
tissues are unremarkable.
IMPRESSION: No fracture or dislocation of the right foot. Joint spaces are well
preserved.
